# Patient Record
Sex: Female | Born: 2000 | Race: Black or African American | Hispanic: No | Marital: Single | Smoking: Never smoker
Health system: Southern US, Community
[De-identification: ages and names within clinical notes are randomized; demographics above are authoritative.]

## PROBLEM LIST (undated history)

## (undated) DIAGNOSIS — Z789 Other specified health status: Secondary | ICD-10-CM

## (undated) DIAGNOSIS — G43909 Migraine, unspecified, not intractable, without status migrainosus: Secondary | ICD-10-CM

## (undated) DIAGNOSIS — F3481 Disruptive mood dysregulation disorder: Secondary | ICD-10-CM

## (undated) DIAGNOSIS — J309 Allergic rhinitis, unspecified: Secondary | ICD-10-CM

## (undated) HISTORY — PX: NO PAST SURGERIES: SHX2092

---
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
## 2020-03-19 ENCOUNTER — Emergency Department (HOSPITAL_COMMUNITY)
Admission: EM | Admit: 2020-03-19 | Discharge: 2020-03-19 | Discharge status: Against Medical Advice | Payer: Medicaid Other

## 2020-03-19 ENCOUNTER — Ambulatory Visit (HOSPITAL_COMMUNITY)
Admission: EM | Admit: 2020-03-19 | Discharge: 2020-03-19 | Disposition: A | Discharge status: Home / Self Care | Payer: Medicaid Other | Attending: Family Medicine | Admitting: Family Medicine

## 2020-03-19 ENCOUNTER — Encounter (HOSPITAL_COMMUNITY): Payer: Self-pay

## 2020-03-19 ENCOUNTER — Other Ambulatory Visit: Payer: Self-pay

## 2020-03-19 DIAGNOSIS — H1031 Unspecified acute conjunctivitis, right eye: Secondary | ICD-10-CM | POA: Diagnosis not present

## 2020-03-19 MED ORDER — GENTAMICIN SULFATE 0.3 % OP SOLN
1.0000 [drp] | OPHTHALMIC | 0 refills | Status: DC
Start: 2020-03-19 — End: 2021-08-04

## 2020-03-19 NOTE — ED Triage Notes (Signed)
Pt presents with right eye redness, swelling, and drainage since last night.

## 2020-03-19 NOTE — ED Provider Notes (Signed)
[facility]    CSN: [id number] Arrival date & time: 03/19/20  1912      History   Chief Complaint Chief Complaint  Patient presents with  . Conjunctivitis    HPI Lori [name] is a 19 y.o. female.  Patient presents with red eye that is draining and itching.  Eye was matted together when she woke up this morning.  No known exposure to conjunctivitis.  She denies any photophobia or pain.  HPI  History reviewed. No pertinent past medical history.  Patient Active Problem List   Diagnosis Date Noted  . Acute bacterial conjunctivitis of right eye 03/19/2020    History reviewed. No pertinent surgical history.  OB History   No obstetric history on file.      Home Medications    Prior to Admission medications   Medication Sig Start Date End Date Taking? Authorizing Provider  gentamicin (GARAMYCIN) 0.3 % ophthalmic solution Place 1 drop into the right eye every 4 (four) hours. 03/19/20   Frederica Kuster, MD    Family History Family History  Family history unknown: Yes    Social History Social History   Tobacco Use  . Smoking status: Never Smoker  Substance Use Topics  . Alcohol use: Not on file  . Drug use: Not on file     Allergies   Patient has no known allergies.   Review of Systems Review of Systems  Eyes: Positive for discharge, redness and itching.  All other systems reviewed and are negative.    Physical Exam Triage Vital Signs ED Triage Vitals  Enc Vitals Group     BP 03/19/20 1941 116/66     Pulse Rate 03/19/20 1941 99     Resp 03/19/20 1941 18     Temp 03/19/20 1941 98.3 F (36.8 C)     Temp Source 03/19/20 1941 Oral     SpO2 03/19/20 1941 95 %     Weight --      Height --      Head Circumference --      Peak Flow --      Pain Score 03/19/20 1942 4     Pain Loc --      Pain Edu? --      Excl. in GC? --    No data found.  Updated Vital Signs BP 116/66 (BP Location: Right Arm)   Pulse 99   Temp 98.3 F (36.8 C)  (Oral)   Resp 18   LMP 03/12/2020   SpO2 95%   Visual Acuity Right Eye Distance:   Left Eye Distance:   Bilateral Distance:    Right Eye Near:   Left Eye Near:    Bilateral Near:     Physical Exam Vitals and nursing note reviewed.  Constitutional:      Appearance: Normal appearance. She is normal weight.  HENT:     Right Ear: Tympanic membrane normal.     Left Ear: Tympanic membrane normal.     Mouth/Throat:     Mouth: Mucous membranes are moist.  Eyes:     General:        Right eye: Discharge present.     Comments: Marked injection, OD  Cardiovascular:     Rate and Rhythm: Normal rate.  Pulmonary:     Effort: Pulmonary effort is normal.  Neurological:     Mental Status: She is alert.      UC Treatments / Results  Labs (all labs ordered are listed,  but only abnormal results are displayed) Labs Reviewed - No data to display  EKG   Radiology No results found.  Procedures Procedures (including critical care time)  Medications Ordered in UC Medications - No data to display  Initial Impression / Assessment and Plan / UC Course  I have reviewed the triage vital signs and the nursing notes.  Pertinent labs & imaging results that were available during my care of the patient were reviewed by me and considered in my medical decision making (see chart for details).     Conjunctivitis, right Final Clinical Impressions(s) / UC Diagnoses   Final diagnoses:  Acute bacterial conjunctivitis of right eye   Discharge Instructions   None    ED Prescriptions    Medication Sig Dispense Auth. Provider   gentamicin (GARAMYCIN) 0.3 % ophthalmic solution Place 1 drop into the right eye every 4 (four) hours. 5 mL Frederica Kuster, MD     PDMP not reviewed this encounter.   Frederica Kuster, MD 03/19/20 [phone]

## 2021-03-11 ENCOUNTER — Emergency Department (HOSPITAL_COMMUNITY)
Admission: EM | Admit: 2021-03-11 | Discharge: 2021-03-11 | Disposition: A | Discharge status: Home / Self Care | Payer: Medicaid Other | Attending: Emergency Medicine | Admitting: Emergency Medicine

## 2021-03-11 ENCOUNTER — Encounter (HOSPITAL_COMMUNITY): Payer: Self-pay

## 2021-03-11 ENCOUNTER — Other Ambulatory Visit: Payer: Self-pay

## 2021-03-11 DIAGNOSIS — J029 Acute pharyngitis, unspecified: Secondary | ICD-10-CM | POA: Insufficient documentation

## 2021-03-11 DIAGNOSIS — Z28310 Unvaccinated for covid-19: Secondary | ICD-10-CM | POA: Diagnosis not present

## 2021-03-11 DIAGNOSIS — Z20822 Contact with and (suspected) exposure to covid-19: Secondary | ICD-10-CM | POA: Insufficient documentation

## 2021-03-11 DIAGNOSIS — R070 Pain in throat: Secondary | ICD-10-CM | POA: Diagnosis present

## 2021-03-11 LAB — POC URINE PREG, ED: Preg Test, Ur: NEGATIVE

## 2021-03-11 LAB — SARS CORONAVIRUS 2 (TAT 6-24 HRS): SARS Coronavirus 2: NEGATIVE

## 2021-03-11 LAB — GROUP A STREP BY PCR: Group A Strep by PCR: NOT DETECTED

## 2021-03-11 NOTE — ED Provider Notes (Signed)
Started [facility]PT Provider Note   CSN: [id number] Arrival date & time: 03/11/21  5701     History Chief Complaint  Patient presents with   Sore Throat    Lori [name] is a 20 y.o. female with no pertinent past medical history that presents emerged department today for sore throat.  Patient states that she is actually really here for work note.  Patient states that she went to work today, however they wanted to come get evaluated because she started developing a sore throat.  Patient states that the sore throat is mild.  Patient states that sore throat began yesterday.  Denies any fevers, cough, congestion, facial pain, ear pain or eye discharge.  Patient denies any sick contacts.  Has not been vaccinated against COVID.  Denies any drooling, dysphagia, difficulty speaking, muffled voice, trouble swallowing, trismus.  Patient also concerned that she could be pregnant, her menstrual cycle is supposed to start today and it did not.  States that its otherwise been on time, denies any OCPs.  Denies any nausea, vomiting, abdominal pain, urinary or vaginal symptoms.  States that she is primarily here for a urine test to see if she is pregnant. HPI     History reviewed. No pertinent past medical history.  Patient Active Problem List   Diagnosis Date Noted   Acute bacterial conjunctivitis of right eye 03/19/2020    History reviewed. No pertinent surgical history.   OB History   No obstetric history on file.     Family History  Family history unknown: Yes    Social History   Tobacco Use   Smoking status: Never    Home Medications Prior to Admission medications   Medication Sig Start Date End Date Taking? Authorizing Provider  gentamicin (GARAMYCIN) 0.3 % ophthalmic solution Place 1 drop into the right eye every 4 (four) hours. 03/19/20   Frederica Kuster, MD    Allergies    Patient has no known allergies.  Review of Systems   Review of  Systems  Constitutional:  Negative for chills, diaphoresis, fatigue and fever.  HENT:  Positive for sore throat. Negative for congestion, ear pain, facial swelling, postnasal drip and trouble swallowing.   Eyes:  Negative for pain and visual disturbance.  Respiratory:  Negative for cough, shortness of breath and wheezing.   Cardiovascular:  Negative for chest pain, palpitations and leg swelling.  Gastrointestinal:  Negative for abdominal distention, abdominal pain, diarrhea, nausea and vomiting.  Genitourinary:  Negative for difficulty urinating.  Musculoskeletal:  Negative for back pain, neck pain and neck stiffness.  Skin:  Negative for pallor.  Neurological:  Negative for dizziness, speech difficulty, weakness and headaches.  Psychiatric/Behavioral:  Negative for confusion.    Physical Exam Updated Vital Signs BP 126/64 (BP Location: Right Arm)   Pulse 77   Temp 98 F (36.7 C) (Oral)   Resp 16   SpO2 100%   Physical Exam Constitutional:      General: She is not in acute distress.    Appearance: Normal appearance. She is not ill-appearing, toxic-appearing or diaphoretic.  HENT:     Head: Normocephalic and atraumatic.     Jaw: There is normal jaw occlusion. No trismus, swelling or malocclusion.     Nose: No congestion or rhinorrhea.     Right Sinus: No maxillary sinus tenderness or frontal sinus tenderness.     Left Sinus: No maxillary sinus tenderness or frontal sinus tenderness.     Mouth/Throat:  Mouth: Mucous membranes are moist. No oral lesions.     Dentition: Normal dentition.     Tongue: No lesions.     Palate: No mass and lesions.     Pharynx: Uvula midline. Pharyngeal swelling and posterior oropharyngeal erythema present. No oropharyngeal exudate or uvula swelling.     Tonsils: No tonsillar exudate or tonsillar abscesses. 1+ on the right. 2+ on the left.     Comments:  Left tonsil appears slightly erythematous, 2+.  No lesions on tonsil.  Not suppurative.  No signs  of peritonsillar abscess, palate without any tenderness or masses palpated.  No swelling under the tongue, uvula is midline without any inflammation. Eyes:     General: No visual field deficit.       Right eye: No discharge.        Left eye: No discharge.     Extraocular Movements: Extraocular movements intact.     Conjunctiva/sclera: Conjunctivae normal.     Pupils: Pupils are equal, round, and reactive to light.  Cardiovascular:     Rate and Rhythm: Normal rate and regular rhythm.     Pulses: Normal pulses.     Heart sounds: Normal heart sounds. No murmur heard.   No friction rub. No gallop.  Pulmonary:     Effort: Pulmonary effort is normal. No respiratory distress.     Breath sounds: Normal breath sounds. No stridor. No wheezing, rhonchi or rales.  Chest:     Chest wall: No tenderness.  Abdominal:     General: Abdomen is flat. Bowel sounds are normal. There is no distension.     Palpations: Abdomen is soft.     Tenderness: There is no abdominal tenderness. There is no right CVA tenderness or left CVA tenderness.  Musculoskeletal:        General: No swelling or tenderness. Normal range of motion.     Cervical back: Normal range of motion. No rigidity or tenderness.     Right lower leg: No edema.     Left lower leg: No edema.  Lymphadenopathy:     Cervical: No cervical adenopathy.  Skin:    General: Skin is warm and dry.     Capillary Refill: Capillary refill takes less than 2 seconds.     Findings: No erythema or rash.  Neurological:     General: No focal deficit present.     Mental Status: She is alert and oriented to person, place, and time.     Cranial Nerves: Cranial nerves are intact. No cranial nerve deficit or facial asymmetry.     Motor: Motor function is intact. No weakness.     Coordination: Coordination is intact.     Gait: Gait is intact. Gait normal.  Psychiatric:        Mood and Affect: Mood normal.    ED Results / Procedures / Treatments   Labs (all  labs ordered are listed, but only abnormal results are displayed) Labs Reviewed  GROUP A STREP BY PCR  SARS CORONAVIRUS 2 (TAT 6-24 HRS)  POC URINE PREG, ED    EKG None  Radiology No results found.  Procedures Procedures   Medications Ordered in ED Medications - No data to display  ED Course  I have reviewed the triage vital signs and the nursing notes.  Pertinent labs & imaging results that were available during my care of the patient were reviewed by me and considered in my medical decision making (see chart for details).    MDM  Rules/Calculators/A&P                          Patient presents emerged from today for mild sore throat.  Physical exam is benign, no signs of peritonsillar abscess, deep space abscess or Ludwig's.  Patient is tolerating fluids well, primarily here for a work note.  Patient also requesting COVID testing and pregnancy testing at this time.  Patient appears very well, no shortness of breath, difficulty breathing.   Strep test negative.  Patient this time.  Patient will follow-up with COVID testing.  Patient was likely with viral pharyngitis, symptomatic treatment discussed.  Patient be discharged.  Doubt need for further emergent work up at this time. I explained the diagnosis and have given explicit precautions to return to the ER including for any other new or worsening symptoms. The patient understands and accepts the medical plan as it's been dictated and I have answered their questions. Discharge instructions concerning home care and prescriptions have been given. The patient is STABLE and is discharged to home in good condition.  .  Final Clinical Impression(s) / ED Diagnoses Final diagnoses:  Viral pharyngitis    Rx / DC Orders ED Discharge Orders     None        Farrel Gordon, PA-C 03/11/21 1215    Jacalyn Lefevre, MD 03/11/21 1525

## 2021-03-11 NOTE — ED Notes (Signed)
Patient at desk inquiring length of wait for results, doctor's note, and discharge paperwork. Advised patient awaiting results and discharge paperwork and doctor's note will be provided at time of discharge.

## 2021-03-11 NOTE — ED Triage Notes (Signed)
Pt reports sore throat since yesterday.  

## 2021-03-11 NOTE — Discharge Instructions (Signed)
  You were evaluated in the Emergency Department and after careful evaluation, we did not find any emergent condition requiring admission or further testing in the hospital.   Your exam/testing today was overall reassuring.  Symptoms seem to be due to a viral syndrome, please use the attached instructions.  Your COVID test is pending, this is positive which you can find on MyChart you need to follow CDC guidelines.  Make sure to take Tylenol instructed on the bottle, use salt water and gargle your mouth.  If you have any new worsening concerning symptoms such as difficulty swallowing, difficulty breathing or difficulty opening your mouth please come back to the ER. Please return to the Emergency Department if you experience any worsening of your condition.  Thank you for allowing [place] to be a part of your care. Please speak to your pharmacist about any new medications prescribed today in regards to side effects or interactions with other medications.

## 2021-05-03 ENCOUNTER — Other Ambulatory Visit: Payer: Self-pay

## 2021-05-03 ENCOUNTER — Encounter (HOSPITAL_BASED_OUTPATIENT_CLINIC_OR_DEPARTMENT_OTHER): Payer: Self-pay | Admitting: Emergency Medicine

## 2021-05-03 ENCOUNTER — Emergency Department (HOSPITAL_BASED_OUTPATIENT_CLINIC_OR_DEPARTMENT_OTHER)
Admission: EM | Admit: 2021-05-03 | Discharge: 2021-05-03 | Disposition: A | Discharge status: Home / Self Care | Payer: Medicaid Other | Attending: Emergency Medicine | Admitting: Emergency Medicine

## 2021-05-03 DIAGNOSIS — Z5321 Procedure and treatment not carried out due to patient leaving prior to being seen by health care provider: Secondary | ICD-10-CM | POA: Diagnosis not present

## 2021-05-03 DIAGNOSIS — Z20822 Contact with and (suspected) exposure to covid-19: Secondary | ICD-10-CM | POA: Insufficient documentation

## 2021-05-03 DIAGNOSIS — R109 Unspecified abdominal pain: Secondary | ICD-10-CM | POA: Insufficient documentation

## 2021-05-03 DIAGNOSIS — O26899 Other specified pregnancy related conditions, unspecified trimester: Secondary | ICD-10-CM | POA: Diagnosis not present

## 2021-05-03 DIAGNOSIS — Z3A00 Weeks of gestation of pregnancy not specified: Secondary | ICD-10-CM | POA: Insufficient documentation

## 2021-05-03 LAB — URINALYSIS, ROUTINE W REFLEX MICROSCOPIC
Bilirubin Urine: NEGATIVE
Glucose, UA: NEGATIVE mg/dL
Hgb urine dipstick: NEGATIVE
Ketones, ur: NEGATIVE mg/dL
Leukocytes,Ua: NEGATIVE
Nitrite: NEGATIVE
Protein, ur: NEGATIVE mg/dL
Specific Gravity, Urine: 1.017 (ref 1.005–1.030)
pH: 6 (ref 5.0–8.0)

## 2021-05-03 LAB — RESP PANEL BY RT-PCR (FLU A&B, COVID) ARPGX2
Influenza A by PCR: NEGATIVE
Influenza B by PCR: NEGATIVE
SARS Coronavirus 2 by RT PCR: NEGATIVE

## 2021-05-03 LAB — PREGNANCY, URINE: Preg Test, Ur: POSITIVE — AB

## 2021-05-03 NOTE — ED Triage Notes (Signed)
Pt reports needing a covid test for work and also took a urine pregnancy test at home which resulted positive.  Pt wants to double check if she is pregnant.  Pt reports mild cramping and is due for her period today.

## 2021-05-15 ENCOUNTER — Other Ambulatory Visit: Payer: Self-pay

## 2021-05-15 ENCOUNTER — Ambulatory Visit (HOSPITAL_COMMUNITY)
Admission: EM | Admit: 2021-05-15 | Discharge: 2021-05-15 | Disposition: A | Discharge status: Home / Self Care | Payer: Medicaid Other | Attending: Physician Assistant | Admitting: Physician Assistant

## 2021-05-15 ENCOUNTER — Encounter (HOSPITAL_COMMUNITY): Payer: Self-pay

## 2021-05-15 DIAGNOSIS — O99351 Diseases of the nervous system complicating pregnancy, first trimester: Secondary | ICD-10-CM | POA: Diagnosis not present

## 2021-05-15 DIAGNOSIS — J029 Acute pharyngitis, unspecified: Secondary | ICD-10-CM | POA: Diagnosis not present

## 2021-05-15 DIAGNOSIS — G43909 Migraine, unspecified, not intractable, without status migrainosus: Secondary | ICD-10-CM | POA: Diagnosis not present

## 2021-05-15 DIAGNOSIS — Z3A01 Less than 8 weeks gestation of pregnancy: Secondary | ICD-10-CM | POA: Insufficient documentation

## 2021-05-15 DIAGNOSIS — J3489 Other specified disorders of nose and nasal sinuses: Secondary | ICD-10-CM | POA: Diagnosis not present

## 2021-05-15 DIAGNOSIS — J069 Acute upper respiratory infection, unspecified: Secondary | ICD-10-CM

## 2021-05-15 DIAGNOSIS — R051 Acute cough: Secondary | ICD-10-CM | POA: Insufficient documentation

## 2021-05-15 DIAGNOSIS — O99511 Diseases of the respiratory system complicating pregnancy, first trimester: Secondary | ICD-10-CM

## 2021-05-15 DIAGNOSIS — O26891 Other specified pregnancy related conditions, first trimester: Secondary | ICD-10-CM | POA: Diagnosis not present

## 2021-05-15 DIAGNOSIS — Z20822 Contact with and (suspected) exposure to covid-19: Secondary | ICD-10-CM | POA: Diagnosis not present

## 2021-05-15 LAB — POC INFLUENZA A AND B ANTIGEN (URGENT CARE ONLY)
INFLUENZA A ANTIGEN, POC: NEGATIVE
INFLUENZA B ANTIGEN, POC: NEGATIVE

## 2021-05-15 NOTE — ED Triage Notes (Signed)
Pt presents with c/o migraine and sore throat x 1 week.

## 2021-05-15 NOTE — ED Provider Notes (Signed)
[facility]    CSN: [id number] Arrival date & time: 05/15/21  1954      History   Chief Complaint Chief Complaint  Patient presents with   Sore Throat   Migraine    HPI Lori [name] is a 20 y.o. female.   Patient presents today with a several day history of URI symptoms.  Reports sore/scratchy throat, recurrent headaches, cough, nasal congestion, sinus pressure.  She denies any chest pain, shortness of breath, fever, nausea, vomiting.  She is [redacted] weeks pregnant with her first child.  She denies any known sick contacts.  She has not had COVID-19 vaccination or been diagnosed with COVID-19 in the past.  She does have a history of seasonal allergies but has not required antihistamines in many years.  She has tried Tylenol without improvement of symptoms.  She denies any history of asthma, chronic lung condition, smoking.  She did have to leave work early as result of symptoms and is requesting excuse note today.   History reviewed. No pertinent past medical history.  Patient Active Problem List   Diagnosis Date Noted   Acute bacterial conjunctivitis of right eye 03/19/2020    History reviewed. No pertinent surgical history.  OB History     Gravida  1   Para      Term      Preterm      AB      Living         SAB      IAB      Ectopic      Multiple      Live Births               Home Medications    Prior to Admission medications   Medication Sig Start Date End Date Taking? Authorizing Provider  gentamicin (GARAMYCIN) 0.3 % ophthalmic solution Place 1 drop into the right eye every 4 (four) hours. 03/19/20   Frederica Kuster, MD    Family History Family History  Family history unknown: Yes    Social History Social History   Tobacco Use   Smoking status: Never   Smokeless tobacco: Never     Allergies   Patient has no known allergies.   Review of Systems Review of Systems  Constitutional:  Positive for activity change.  Negative for appetite change, fatigue and fever.  HENT:  Positive for congestion, postnasal drip, sinus pressure and sore throat. Negative for sneezing.   Respiratory:  Positive for cough. Negative for shortness of breath.   Cardiovascular:  Negative for chest pain.  Gastrointestinal:  Negative for abdominal pain, diarrhea, nausea and vomiting.  Neurological:  Positive for headaches. Negative for dizziness and light-headedness.    Physical Exam Triage Vital Signs ED Triage Vitals  Enc Vitals Group     BP 05/15/21 2005 119/70     Pulse Rate 05/15/21 2003 86     Resp 05/15/21 2003 18     Temp 05/15/21 2004 98.5 F (36.9 C)     Temp Source 05/15/21 2004 Oral     SpO2 05/15/21 2003 99 %     Weight --      Height --      Head Circumference --      Peak Flow --      Pain Score 05/15/21 2000 0     Pain Loc --      Pain Edu? --      Excl. in GC? --  No data found.  Updated Vital Signs BP 119/70   Pulse 86   Temp 98.5 F (36.9 C) (Oral)   Resp 18   LMP 04/09/2021 (Exact Date) Comment: Pt states she found out she is pregnant 3 days ago.  SpO2 99%   Visual Acuity Right Eye Distance:   Left Eye Distance:   Bilateral Distance:    Right Eye Near:   Left Eye Near:    Bilateral Near:     Physical Exam Vitals reviewed.  Constitutional:      General: She is awake. She is not in acute distress.    Appearance: Normal appearance. She is well-developed. She is not ill-appearing.     Comments: Very pleasant female appears stated age in no acute distress sitting comfortably in exam room  HENT:     Head: Normocephalic and atraumatic.     Right Ear: Tympanic membrane, ear canal and external ear normal. Tympanic membrane is not erythematous or bulging.     Left Ear: Tympanic membrane, ear canal and external ear normal. Tympanic membrane is not erythematous or bulging.     Nose:     Right Sinus: Maxillary sinus tenderness and frontal sinus tenderness present.     Left Sinus:  Maxillary sinus tenderness and frontal sinus tenderness present.     Mouth/Throat:     Pharynx: Uvula midline. Posterior oropharyngeal erythema present. No oropharyngeal exudate.     Comments: Erythema and drainage in posterior pharynx Cardiovascular:     Rate and Rhythm: Normal rate and regular rhythm.     Heart sounds: Normal heart sounds, S1 normal and S2 normal. No murmur heard. Pulmonary:     Effort: Pulmonary effort is normal.     Breath sounds: Normal breath sounds. No wheezing, rhonchi or rales.     Comments: Clear to auscultation bilaterally Psychiatric:        Behavior: Behavior is cooperative.     UC Treatments / Results  Labs (all labs ordered are listed, but only abnormal results are displayed) Labs Reviewed  SARS CORONAVIRUS 2 (TAT 6-24 HRS)  POC INFLUENZA A AND B ANTIGEN (URGENT CARE ONLY)    EKG   Radiology No results found.  Procedures Procedures (including critical care time)  Medications Ordered in UC Medications - No data to display  Initial Impression / Assessment and Plan / UC Course  I have reviewed the triage vital signs and the nursing notes.  Pertinent labs & imaging results that were available during my care of the patient were reviewed by me and considered in my medical decision making (see chart for details).      Discussed likely viral etiology of symptoms.  No evidence of acute infection that warrant initiation of antibiotics today.  Flu testing was negative.  COVID test is pending.  Patient was instructed to remain in isolation until COVID test are obtained and she was given work excuse note with current CDC return to work guidelines.  Given she is pregnant, there are very few medications she can use to treat her symptoms we discussed that she should use rest and fluid for the majority of symptoms but can use nasal saline and Tylenol if needed.  Discussed alarm symptoms that warrant emergent evaluation.  Strict return precautions given to  which she expressed understanding.  Final Clinical Impressions(s) / UC Diagnoses   Final diagnoses:  Upper respiratory tract infection, unspecified type  Sore throat  Sinus pressure  Acute cough  [redacted] weeks gestation of pregnancy  Discharge Instructions      Your flu testing was negative.  We will contact you with your COVID test if it is positive.  I suspect that you have a virus.  Please continue using over-the-counter medication such as Tylenol for symptom relief.  If you have any worsening symptoms please return for reevaluation.     ED Prescriptions   None    PDMP not reviewed this encounter.   Jeani Hawking, PA-C 05/15/21 2039

## 2021-05-15 NOTE — Discharge Instructions (Addendum)
Your flu testing was negative.  We will contact you with your COVID test if it is positive.  I suspect that you have a virus.  Please continue using over-the-counter medication such as Tylenol for symptom relief.  If you have any worsening symptoms please return for reevaluation.

## 2021-05-16 LAB — SARS CORONAVIRUS 2 (TAT 6-24 HRS): SARS Coronavirus 2: NEGATIVE

## 2021-08-04 ENCOUNTER — Other Ambulatory Visit: Payer: Self-pay

## 2021-08-04 ENCOUNTER — Ambulatory Visit (HOSPITAL_COMMUNITY)
Admission: EM | Admit: 2021-08-04 | Discharge: 2021-08-04 | Disposition: A | Discharge status: Home / Self Care | Payer: Medicaid Other

## 2021-08-04 ENCOUNTER — Encounter (HOSPITAL_COMMUNITY): Payer: Self-pay

## 2021-08-04 ENCOUNTER — Encounter (HOSPITAL_COMMUNITY): Payer: Self-pay | Admitting: Obstetrics & Gynecology

## 2021-08-04 ENCOUNTER — Inpatient Hospital Stay (HOSPITAL_COMMUNITY)
Admission: AD | Admit: 2021-08-04 | Discharge: 2021-08-04 | Disposition: A | Discharge status: Home / Self Care | Payer: Medicaid Other | Attending: Obstetrics & Gynecology | Admitting: Obstetrics & Gynecology

## 2021-08-04 ENCOUNTER — Inpatient Hospital Stay (HOSPITAL_COMMUNITY): Payer: Medicaid Other

## 2021-08-04 DIAGNOSIS — Z8759 Personal history of other complications of pregnancy, childbirth and the puerperium: Secondary | ICD-10-CM | POA: Diagnosis not present

## 2021-08-04 DIAGNOSIS — Z6790 Unspecified blood type, Rh positive: Secondary | ICD-10-CM

## 2021-08-04 DIAGNOSIS — O209 Hemorrhage in early pregnancy, unspecified: Secondary | ICD-10-CM | POA: Insufficient documentation

## 2021-08-04 DIAGNOSIS — Z3A00 Weeks of gestation of pregnancy not specified: Secondary | ICD-10-CM | POA: Diagnosis not present

## 2021-08-04 DIAGNOSIS — O4690 Antepartum hemorrhage, unspecified, unspecified trimester: Secondary | ICD-10-CM | POA: Diagnosis not present

## 2021-08-04 DIAGNOSIS — Z3200 Encounter for pregnancy test, result unknown: Secondary | ICD-10-CM

## 2021-08-04 DIAGNOSIS — O3680X0 Pregnancy with inconclusive fetal viability, not applicable or unspecified: Secondary | ICD-10-CM | POA: Diagnosis not present

## 2021-08-04 DIAGNOSIS — O26891 Other specified pregnancy related conditions, first trimester: Secondary | ICD-10-CM | POA: Insufficient documentation

## 2021-08-04 DIAGNOSIS — N939 Abnormal uterine and vaginal bleeding, unspecified: Secondary | ICD-10-CM | POA: Diagnosis not present

## 2021-08-04 DIAGNOSIS — R102 Pelvic and perineal pain: Secondary | ICD-10-CM | POA: Diagnosis not present

## 2021-08-04 LAB — URINALYSIS, MICROSCOPIC (REFLEX)

## 2021-08-04 LAB — URINALYSIS, ROUTINE W REFLEX MICROSCOPIC
Bilirubin Urine: NEGATIVE
Glucose, UA: NEGATIVE mg/dL
Ketones, ur: NEGATIVE mg/dL
Leukocytes,Ua: NEGATIVE
Nitrite: NEGATIVE
Protein, ur: 30 mg/dL — AB
Specific Gravity, Urine: >1.03 — ABNORMAL HIGH (ref 1.005–1.030)
pH: 6 (ref 5.0–8.0)

## 2021-08-04 LAB — WET PREP, GENITAL
Clue Cells Wet Prep HPF POC: NONE SEEN
Sperm: NONE SEEN
Trich, Wet Prep: NONE SEEN
WBC, Wet Prep HPF POC: >=10 — AB (ref <10)
Yeast Wet Prep HPF POC: NONE SEEN

## 2021-08-04 LAB — ABO/RH: ABO/RH(D): A POS

## 2021-08-04 LAB — CBC
HCT: 32.1 % — ABNORMAL LOW (ref 36.0–46.0)
Hemoglobin: 10.7 g/dL — ABNORMAL LOW (ref 12.0–15.0)
MCH: 28.5 pg (ref 26.0–34.0)
MCHC: 33.3 g/dL (ref 30.0–36.0)
MCV: 85.6 fL (ref 80.0–100.0)
Platelets: 207 10*3/uL (ref 150–400)
RBC: 3.75 MIL/uL — ABNORMAL LOW (ref 3.87–5.11)
RDW: 14.2 % (ref 11.5–15.5)
WBC: 4.8 10*3/uL (ref 4.0–10.5)
nRBC: 0 % (ref 0.0–0.2)

## 2021-08-04 LAB — HCG, QUANTITATIVE, PREGNANCY: hCG, Beta Chain, Quant, S: 45 m[IU]/mL — ABNORMAL HIGH (ref <5)

## 2021-08-04 LAB — POCT PREGNANCY, URINE: Preg Test, Ur: POSITIVE — AB

## 2021-08-04 MED ORDER — ACETAMINOPHEN 500 MG PO TABS: 1000.0000 mg | ORAL_TABLET | Freq: Four times a day (QID) | ORAL | Status: DC | PRN

## 2021-08-04 NOTE — ED Triage Notes (Signed)
Pt presents for HCG blood work.  States she had an abortion on 11/26 and states she recently took at home pregnancy tests that resulted positive.   Pt reports vaginal bleeding and abdominal cramping X 1 week-2.

## 2021-08-04 NOTE — MAU Note (Addendum)
Presents with c/o abdominal cramping and VB.  States had a abortion 07/04/2021, unsure if new pregnancy.  Reports has had unprotected sex since abortion. Reports went to Urgent Care and instructed to go to MAU for evaluation.

## 2021-08-04 NOTE — MAU Provider Note (Signed)
History     CSN: [id number]  Arrival date and time: 08/04/21 5284   Event Date/Time   First Provider Initiated Contact with Patient 08/04/21 1023      Chief Complaint  Patient presents with   Abdominal Pain   Vaginal Bleeding   20 y.o. G1 s/p surgical termination on 07/04/21 presenting with VB and cramping. Reports onset of sx 1.5 wks ago. She had stopped bleeding 1 week after the procedure. Bleeding has been like a period and requires pads, she is changing q2 hr but not saturating. Rates pain 8/10. Has not treated it. She reports having unprotected IC 4 times since her termination.   OB History     Gravida  1   Para      Term      Preterm      AB      Living         SAB      IAB      Ectopic      Multiple      Live Births              History reviewed. No pertinent past medical history.  History reviewed. No pertinent surgical history.  Family History  Problem Relation Age of Onset   Varicose Veins Father     Social History   Tobacco Use   Smoking status: Never   Smokeless tobacco: Never  Vaping Use   Vaping Use: Some days  Substance Use Topics   Drug use: Yes    Types: Marijuana    Allergies: No Known Allergies  Medications Prior to Admission  Medication Sig Dispense Refill Last Dose   gentamicin (GARAMYCIN) 0.3 % ophthalmic solution Place 1 drop into the right eye every 4 (four) hours. 5 mL 0     Review of Systems  Constitutional:  Negative for fever.  Gastrointestinal:  Positive for abdominal pain.  Genitourinary:  Positive for vaginal bleeding.  Physical Exam   Blood pressure 108/80, pulse 66, temperature 98.7 F (37.1 C), temperature source Oral, resp. rate 16, height 5\' 7"  (1.702 m), weight 64.5 kg, last menstrual period 04/09/2021, SpO2 100 %.  Physical Exam Vitals and nursing note reviewed. Exam conducted with a chaperone present.  Constitutional:      General: She is not in acute distress.    Appearance: Normal  appearance.  HENT:     Head: Normocephalic and atraumatic.  Pulmonary:     Effort: Pulmonary effort is normal. No respiratory distress.  Abdominal:     General: There is no distension.     Palpations: Abdomen is soft. There is no mass.     Tenderness: There is no abdominal tenderness. There is no guarding or rebound.     Hernia: No hernia is present.  Genitourinary:    Comments: External: no lesions or erythema Vagina: rugated, pink, moist, small amt drk red bloody discharge Uterus: non enlarged, anteverted, non tender, no CMT Adnexae: no masses, no tenderness left, + tenderness right Cervix closed   Musculoskeletal:        General: Normal range of motion.     Cervical back: Normal range of motion.  Skin:    General: Skin is warm and dry.  Neurological:     General: No focal deficit present.     Mental Status: She is alert and oriented to person, place, and time.  Psychiatric:        Mood and Affect: Mood normal.  Behavior: Behavior normal.   Results for orders placed or performed during the hospital encounter of 08/04/21 (from the past 24 hour(s))  Pregnancy, urine POC     Status: Abnormal   Collection Time: 08/04/21  9:52 AM  Result Value Ref Range   Preg Test, Ur POSITIVE (A) NEGATIVE  Urinalysis, Routine w reflex microscopic Urine, Clean Catch     Status: Abnormal   Collection Time: 08/04/21  9:55 AM  Result Value Ref Range   Color, Urine YELLOW YELLOW   APPearance CLEAR CLEAR   Specific Gravity, Urine >1.030 (H) 1.005 - 1.030   pH 6.0 5.0 - 8.0   Glucose, UA NEGATIVE NEGATIVE mg/dL   Hgb urine dipstick LARGE (A) NEGATIVE   Bilirubin Urine NEGATIVE NEGATIVE   Ketones, ur NEGATIVE NEGATIVE mg/dL   Protein, ur 30 (A) NEGATIVE mg/dL   Nitrite NEGATIVE NEGATIVE   Leukocytes,Ua NEGATIVE NEGATIVE  Urinalysis, Microscopic (reflex)     Status: Abnormal   Collection Time: 08/04/21  9:55 AM  Result Value Ref Range   RBC / HPF 0-5 0 - 5 RBC/hpf   WBC, UA 0-5 0 - 5  WBC/hpf   Bacteria, UA FEW (A) NONE SEEN   Squamous Epithelial / LPF 6-10 0 - 5   Non Squamous Epithelial PRESENT (A) NONE SEEN  Wet prep, genital     Status: Abnormal   Collection Time: 08/04/21 10:36 AM   Specimen: PATH Cytology Cervicovaginal Ancillary Only  Result Value Ref Range   Yeast Wet Prep HPF POC NONE SEEN NONE SEEN   Trich, Wet Prep NONE SEEN NONE SEEN   Clue Cells Wet Prep HPF POC NONE SEEN NONE SEEN   WBC, Wet Prep HPF POC >=10 (A) <10   Sperm NONE SEEN   CBC     Status: Abnormal   Collection Time: 08/04/21 10:42 AM  Result Value Ref Range   WBC 4.8 4.0 - 10.5 K/uL   RBC 3.75 (L) 3.87 - 5.11 MIL/uL   Hemoglobin 10.7 (L) 12.0 - 15.0 g/dL   HCT 37.5 (L) 43.6 - 06.7 %   MCV 85.6 80.0 - 100.0 fL   MCH 28.5 26.0 - 34.0 pg   MCHC 33.3 30.0 - 36.0 g/dL   RDW 70.3 40.3 - 52.4 %   Platelets 207 150 - 400 K/uL   nRBC 0.0 0.0 - 0.2 %  ABO/Rh     Status: None   Collection Time: 08/04/21 10:42 AM  Result Value Ref Range   ABO/RH(D)      A POS Performed at [facility], [address], [place], [place] [address]   hCG, quantitative, pregnancy     Status: Abnormal   Collection Time: 08/04/21 10:42 AM  Result Value Ref Range   hCG, Beta Chain, Quant, S 45 (H) <5 mIU/mL   US OB LESS THAN 14 WEEKS WITH OB TRANSVAGINAL  Result Date: 08/04/2021 CLINICAL DATA:  Vaginal bleeding EXAM: OBSTETRIC <14 WK [place] AND TRANSVAGINAL OB US DOPPLER ULTRASOUND OF OVARIES TECHNIQUE: Both transabdominal and transvaginal ultrasound examinations were performed for complete evaluation of the gestation as well as the maternal uterus, adnexal regions, and pelvic cul-de-sac. Transvaginal technique was performed to assess early pregnancy. Color and duplex Doppler ultrasound was utilized to evaluate blood flow to the ovaries. COMPARISON:  None. FINDINGS: Intrauterine gestational sac: Not seen Yolk sac:  Not seen Embryo:  Not seen Cardiac Activity: Not seen Endometrial stripe measures approximately 3.8  mm. Cervix is closed. There is no demonstrable  fluid collection in the endometrial cavity. Maternal uterus/adnexae: There is 2.1 cm complex cyst in the right adnexa. Small follicles are seen in the left ovary. There is no free fluid in the pelvis. IMPRESSION: There is no demonstrable intrauterine gestational sac. If pregnancy test is positive, differential diagnostic possibilities would include failed gestation with complete abortion or very early IUP or ectopic gestation. Serial HCG estimations and follow-up sonogram as warranted should be considered. There is 2.1 cm complex cyst in the right adnexa, possibly hemorrhagic cyst/follicle. Electronically Signed   By: Ernie Avena M.D.   On: 08/04/2021 11:30    MAU Course  Procedures Tylenol  MDM Labs and [place] ordered and reviewed. No IUGS, YS or FP seen on [place], findings could indicate new pregnancy or could be from recent pregnancy termination. Will follow quant in 48 hrs. Stable for discharge home.   Assessment and Plan   1. Pregnancy, location unknown   2. Vaginal bleeding in pregnancy   3. Blood type, Rh positive    Discharge home Follow up at [facility] in 2 days Ectopic/SAB precautions Abstain from IC or use condoms  Allergies as of 08/04/2021   No Known Allergies      Medication List     STOP taking these medications    gentamicin 0.3 % ophthalmic solution Commonly known as: Lynwood Dawley, CNM 08/04/2021, 12:13 PM

## 2021-08-04 NOTE — ED Provider Notes (Signed)
[facility]    CSN: [id number] Arrival date & time: 08/04/21  0827      History   Chief Complaint Chief Complaint  Patient presents with   Vaginal Bleeding   Abdominal Pain    HPI Lori [name] is a 20 y.o. female.   Patient here today for evaluation of abdominal cramping and bleeding she has had the last several weeks. She reports that she had a surgical abortion on 11/26. Since that time she has had unprotected sex. She took an at home pregnancy test over a week ago that was positive.   The history is provided by the patient.  Vaginal Bleeding Associated symptoms: abdominal pain and vaginal discharge   Associated symptoms: no fever and no nausea   Abdominal Pain Associated symptoms: vaginal bleeding and vaginal discharge   Associated symptoms: no chills, no fever, no nausea, no shortness of breath and no vomiting    History reviewed. No pertinent past medical history.  Patient Active Problem List   Diagnosis Date Noted   Acute bacterial conjunctivitis of right eye 03/19/2020    History reviewed. No pertinent surgical history.  OB History     Gravida  1   Para      Term      Preterm      AB      Living         SAB      IAB      Ectopic      Multiple      Live Births               Home Medications    Prior to Admission medications   Medication Sig Start Date End Date Taking? Authorizing Provider  gentamicin (GARAMYCIN) 0.3 % ophthalmic solution Place 1 drop into the right eye every 4 (four) hours. 03/19/20   Frederica Kuster, MD    Family History Family History  Family history unknown: Yes    Social History Social History   Tobacco Use   Smoking status: Never   Smokeless tobacco: Never     Allergies   Patient has no known allergies.   Review of Systems Review of Systems  Constitutional:  Negative for chills and fever.  Eyes:  Negative for discharge and redness.  Respiratory:  Negative for shortness of  breath.   Gastrointestinal:  Positive for abdominal pain. Negative for nausea and vomiting.  Genitourinary:  Positive for vaginal bleeding and vaginal discharge.    Physical Exam Triage Vital Signs ED Triage Vitals  Enc Vitals Group     BP 08/04/21 0853 116/66     Pulse Rate 08/04/21 0853 85     Resp 08/04/21 0853 17     Temp --      Temp src --      SpO2 08/04/21 0853 98 %     Weight --      Height --      Head Circumference --      Peak Flow --      Pain Score 08/04/21 0852 0     Pain Loc --      Pain Edu? --      Excl. in GC? --    No data found.  Updated Vital Signs BP 116/66 (BP Location: Left Arm)    Pulse 85    Resp 17    LMP 04/09/2021 (Exact Date) Comment: Pt states she found out she is pregnant 3 days ago.  SpO2 98%      Physical Exam Vitals and nursing note reviewed.  Constitutional:      General: She is not in acute distress.    Appearance: Normal appearance. She is not ill-appearing.  HENT:     Head: Normocephalic and atraumatic.  Eyes:     Conjunctiva/sclera: Conjunctivae normal.  Cardiovascular:     Rate and Rhythm: Normal rate.  Pulmonary:     Effort: Pulmonary effort is normal.  Neurological:     Mental Status: She is alert.  Psychiatric:        Mood and Affect: Mood normal.        Behavior: Behavior normal.        Thought Content: Thought content normal.     UC Treatments / Results  Labs (all labs ordered are listed, but only abnormal results are displayed) Labs Reviewed - No data to display  EKG   Radiology No results found.  Procedures Procedures (including critical care time)  Medications Ordered in UC Medications - No data to display  Initial Impression / Assessment and Plan / UC Course  I have reviewed the triage vital signs and the nursing notes.  Pertinent labs & imaging results that were available during my care of the patient were reviewed by me and considered in my medical decision making (see chart for details).     Recommended further evaluation in [facility] ED as I suspect she will need transvaginal ultrasound, stat blood work etc. Patient is stable on discharge and will report to ED via POV after leaving our office.  Final Clinical Impressions(s) / UC Diagnoses   Final diagnoses:  Vaginal bleeding  Possible pregnancy  Abortion history   Discharge Instructions   None    ED Prescriptions   None    PDMP not reviewed this encounter.   Tomi Bamberger, PA-C 08/04/21 [phone]

## 2021-08-05 LAB — GC/CHLAMYDIA PROBE AMP (~~LOC~~) NOT AT ARMC
Chlamydia: NEGATIVE
Comment: NEGATIVE
Comment: NORMAL
Neisseria Gonorrhea: NEGATIVE

## 2021-08-06 ENCOUNTER — Ambulatory Visit (INDEPENDENT_AMBULATORY_CARE_PROVIDER_SITE_OTHER): Payer: Medicaid Other

## 2021-08-06 ENCOUNTER — Other Ambulatory Visit: Payer: Self-pay

## 2021-08-06 VITALS — BP 115/64 | HR 61 | Wt 141.7 lb

## 2021-08-06 DIAGNOSIS — O3680X0 Pregnancy with inconclusive fetal viability, not applicable or unspecified: Secondary | ICD-10-CM

## 2021-08-06 LAB — BETA HCG QUANT (REF LAB): hCG Quant: 30 m[IU]/mL

## 2021-08-06 NOTE — Progress Notes (Signed)
Pt here today for STAT beta s/p abortion on 07/04/21.  Pt reports that she is having vaginal bleeding that she wears regular tampon that she would say she changes about 3-4 times a day.  Pt denies pain at this time.  Pt explained that we call her with results in approximately two hours and f/u.  Pt verbalized understanding.  Received notification from [facility] that pt's beta results are 30.  Received recommendation by Dr. Debroah Loop who requests pt to come back in one week for non stat beta to trend levels down.  Called and left message that I am calling with results and f/u to please give the office a callback.    Pt returned call back.  I informed her the results and recommendation from provider.  Pt stated that she will be able to come in on 08/13/20 @ 1000 for a non stat beta.   Leonette Nutting  08/06/21

## 2021-08-09 NOTE — L&D Delivery Note (Signed)
LABOR COURSE Patient was admitted for SOL. She progressed to complete without intervention.  Delivery Note Patient reported to be C/C/+3. I was asked to be on standby for delivery. Patient crowning when I entered room. Head delivered ROT. Tight nuchal present x 1 loop, delivered through. Shoulder and body delivered in usual fashion. At  [place] a viable female was delivered via Vaginal, Spontaneous (Presentation:ROT ; ROA).  Infant with spontaneous cry, placed on mother's abdomen, dried and stimulated. Cord clamped x 2 after two-minute delay, and cut by FOB. Cord blood drawn. Placenta delivered spontaneously with gentle cord traction. Appears intact. Fundus firm with massage and Pitocin. Labia, perineum, vagina, and cervix inspected.    APGAR: 9, 10; weight: 3260g .   Cord: 3VC with the following complications:N/A.    Anesthesia:  Epidural Episiotomy: None Lacerations: Bilateral labial abrasions, hemostatic and therefore not repaired Suture Repair: N/A Est. Blood Loss (mL): 158  Mom to postpartum.  Baby to Couplet care / Skin to Skin.  0530: I was asked to return to bedside to evaluate patient's bleeding. Two large clots delivered with manual sweep. Fundus firm and 3 below U following sweep. Tolerated well by patient. Dr. Charlesetta Garibaldi presented to bedside during discussion of expectations for bleeding in the postpartum period. I gave report to MD and excused myself from the room. Care assumed by Dr. Charlesetta Garibaldi.  Mallie Snooks, CNM 06/14/22 6:45 AM

## 2021-08-12 ENCOUNTER — Other Ambulatory Visit: Payer: Self-pay

## 2021-08-12 DIAGNOSIS — O4690 Antepartum hemorrhage, unspecified, unspecified trimester: Secondary | ICD-10-CM

## 2021-08-13 ENCOUNTER — Other Ambulatory Visit: Payer: Self-pay

## 2021-08-13 ENCOUNTER — Other Ambulatory Visit: Payer: Medicaid Other

## 2021-08-13 DIAGNOSIS — O4690 Antepartum hemorrhage, unspecified, unspecified trimester: Secondary | ICD-10-CM

## 2021-08-14 ENCOUNTER — Encounter: Payer: Self-pay | Admitting: *Deleted

## 2021-08-14 LAB — BETA HCG QUANT (REF LAB): hCG Quant: 9 m[IU]/mL

## 2021-08-18 ENCOUNTER — Other Ambulatory Visit: Payer: Self-pay

## 2021-08-18 DIAGNOSIS — O3680X0 Pregnancy with inconclusive fetal viability, not applicable or unspecified: Secondary | ICD-10-CM

## 2021-08-18 DIAGNOSIS — O4690 Antepartum hemorrhage, unspecified, unspecified trimester: Secondary | ICD-10-CM

## 2021-08-20 ENCOUNTER — Other Ambulatory Visit: Payer: Self-pay

## 2021-08-20 ENCOUNTER — Other Ambulatory Visit: Payer: Medicaid Other

## 2021-08-20 DIAGNOSIS — O3680X0 Pregnancy with inconclusive fetal viability, not applicable or unspecified: Secondary | ICD-10-CM

## 2021-08-20 DIAGNOSIS — O4690 Antepartum hemorrhage, unspecified, unspecified trimester: Secondary | ICD-10-CM

## 2021-08-21 LAB — BETA HCG QUANT (REF LAB): hCG Quant: 3 m[IU]/mL

## 2021-09-15 ENCOUNTER — Ambulatory Visit (HOSPITAL_COMMUNITY)
Admission: EM | Admit: 2021-09-15 | Discharge: 2021-09-15 | Discharge status: Against Medical Advice | Payer: Medicaid Other

## 2021-10-19 ENCOUNTER — Telehealth: Payer: Self-pay | Admitting: Family Medicine

## 2021-10-19 NOTE — Telephone Encounter (Signed)
Patient called in wanting to get scheduled for her first New OB appt, I pulled her up and she already scheduled at the [facility] ans she stated she was going to cancel due to she didn't want to go to the office, I explained to her the same providers that work there wok here and she still wanted to be scheduled here, I told her we need proof pf pregnancy and she can do a walk in pregnancy test I gave her the hours we do them Mon through Friday 8:30 to 3:30 ( the office close for lunch 12 to 1 pm). Patient understood and the call ended.  ?

## 2021-10-27 ENCOUNTER — Encounter: Payer: Medicaid Other | Admitting: Obstetrics

## 2021-12-03 LAB — HEPATITIS C ANTIBODY: HCV Ab: NEGATIVE

## 2021-12-03 LAB — OB RESULTS CONSOLE HEPATITIS B SURFACE ANTIGEN: Hepatitis B Surface Ag: NEGATIVE

## 2021-12-03 LAB — OB RESULTS CONSOLE HIV ANTIBODY (ROUTINE TESTING): HIV: NONREACTIVE

## 2021-12-03 LAB — OB RESULTS CONSOLE RUBELLA ANTIBODY, IGM: Rubella: IMMUNE

## 2021-12-07 LAB — OB RESULTS CONSOLE GC/CHLAMYDIA
Chlamydia: NEGATIVE
Neisseria Gonorrhea: NEGATIVE

## 2021-12-23 ENCOUNTER — Other Ambulatory Visit: Payer: Self-pay

## 2021-12-23 ENCOUNTER — Emergency Department (HOSPITAL_BASED_OUTPATIENT_CLINIC_OR_DEPARTMENT_OTHER)
Admission: EM | Admit: 2021-12-23 | Discharge: 2021-12-23 | Disposition: A | Discharge status: Home / Self Care | Payer: Medicaid Other | Attending: Emergency Medicine | Admitting: Emergency Medicine

## 2021-12-23 ENCOUNTER — Encounter (HOSPITAL_BASED_OUTPATIENT_CLINIC_OR_DEPARTMENT_OTHER): Payer: Self-pay

## 2021-12-23 DIAGNOSIS — Z3A13 13 weeks gestation of pregnancy: Secondary | ICD-10-CM | POA: Insufficient documentation

## 2021-12-23 DIAGNOSIS — R1030 Lower abdominal pain, unspecified: Secondary | ICD-10-CM | POA: Insufficient documentation

## 2021-12-23 DIAGNOSIS — O26891 Other specified pregnancy related conditions, first trimester: Secondary | ICD-10-CM | POA: Diagnosis present

## 2021-12-23 DIAGNOSIS — R519 Headache, unspecified: Secondary | ICD-10-CM | POA: Diagnosis not present

## 2021-12-23 DIAGNOSIS — R42 Dizziness and giddiness: Secondary | ICD-10-CM | POA: Insufficient documentation

## 2021-12-23 LAB — COMPREHENSIVE METABOLIC PANEL
ALT: 8 U/L (ref 0–44)
AST: 12 U/L — ABNORMAL LOW (ref 15–41)
Albumin: 4.1 g/dL (ref 3.5–5.0)
Alkaline Phosphatase: 28 U/L — ABNORMAL LOW (ref 38–126)
Anion gap: 9 (ref 5–15)
BUN: 12 mg/dL (ref 6–20)
CO2: 23 mmol/L (ref 22–32)
Calcium: 8.9 mg/dL (ref 8.9–10.3)
Chloride: 102 mmol/L (ref 98–111)
Creatinine, Ser: 0.6 mg/dL (ref 0.44–1.00)
GFR, Estimated: >60 mL/min (ref >60)
Glucose, Bld: 89 mg/dL (ref 70–99)
Potassium: 3.5 mmol/L (ref 3.5–5.1)
Sodium: 134 mmol/L — ABNORMAL LOW (ref 135–145)
Total Bilirubin: 0.2 mg/dL — ABNORMAL LOW (ref 0.3–1.2)
Total Protein: 7.5 g/dL (ref 6.5–8.1)

## 2021-12-23 LAB — CBC WITH DIFFERENTIAL/PLATELET
Abs Immature Granulocytes: 0.02 10*3/uL (ref 0.00–0.07)
Basophils Absolute: 0 10*3/uL (ref 0.0–0.1)
Basophils Relative: 0 %
Eosinophils Absolute: 0.1 10*3/uL (ref 0.0–0.5)
Eosinophils Relative: 2 %
HCT: 31.9 % — ABNORMAL LOW (ref 36.0–46.0)
Hemoglobin: 10.5 g/dL — ABNORMAL LOW (ref 12.0–15.0)
Immature Granulocytes: 0 %
Lymphocytes Relative: 27 %
Lymphs Abs: 1.8 10*3/uL (ref 0.7–4.0)
MCH: 27.1 pg (ref 26.0–34.0)
MCHC: 32.9 g/dL (ref 30.0–36.0)
MCV: 82.4 fL (ref 80.0–100.0)
Monocytes Absolute: 0.5 10*3/uL (ref 0.1–1.0)
Monocytes Relative: 7 %
Neutro Abs: 4.3 10*3/uL (ref 1.7–7.7)
Neutrophils Relative %: 64 %
Platelets: 233 10*3/uL (ref 150–400)
RBC: 3.87 MIL/uL (ref 3.87–5.11)
RDW: 15.8 % — ABNORMAL HIGH (ref 11.5–15.5)
WBC: 6.8 10*3/uL (ref 4.0–10.5)
nRBC: 0 % (ref 0.0–0.2)

## 2021-12-23 LAB — URINALYSIS, ROUTINE W REFLEX MICROSCOPIC
Bilirubin Urine: NEGATIVE
Glucose, UA: NEGATIVE mg/dL
Hgb urine dipstick: NEGATIVE
Ketones, ur: NEGATIVE mg/dL
Leukocytes,Ua: NEGATIVE
Nitrite: NEGATIVE
Specific Gravity, Urine: 1.033 — ABNORMAL HIGH (ref 1.005–1.030)
pH: 6 (ref 5.0–8.0)

## 2021-12-23 LAB — HCG, QUANTITATIVE, PREGNANCY: hCG, Beta Chain, Quant, S: 47930 m[IU]/mL — ABNORMAL HIGH (ref <5)

## 2021-12-23 MED ORDER — ACETAMINOPHEN 325 MG PO TABS
650.0000 mg | ORAL_TABLET | Freq: Once | ORAL | Status: AC
  Administered 2021-12-23: 650 mg via ORAL
  Filled 2021-12-23: qty 2

## 2021-12-23 NOTE — ED Triage Notes (Signed)
She c/o feeling "lightheaded" at work today. She also tells me she is just >[redacted] weeks gestation. She is ambulatory and in no distress. ?

## 2021-12-23 NOTE — ED Provider Notes (Signed)
[facility] EMERGENCY DEPT Provider Note   CSN: [place]:[id number] Arrival date & time: 12/23/21  1507     History Chief Complaint  Patient presents with   Dizziness    Lori [name] is a 21 y.o. female.  21 year old female G1, P0 currently [redacted]w[redacted]d pregnant presents to the ED with a chief complaint of feeling lightheaded x prior to arrival.  Patient reports she was at work, currently employed at a fast [employer] please, states that she began to feel lightheaded and dizzy, decided to drive to the ED for further evaluation.  She did have a recent checkup last week with an ultrasound to confirm IUP.  She does endorse some headaches, these have been ongoing throughout her pregnancy, she usually takes Tylenol for these however did not have any today available to her.  He does endorse some lower abdominal cramping however has not had any vaginal bleeding.  No fevers, no nausea, no vomiting, no vaginal discharge.  The history is provided by the patient and medical records.  Dizziness Quality:  Unable to specify Severity:  Moderate Associated symptoms: no chest pain, no diarrhea, no nausea, no shortness of breath and no vomiting       Home Medications Prior to Admission medications   Not on File      Allergies    Patient has no known allergies.    Review of Systems   Review of Systems  Constitutional:  Negative for chills and fever.  HENT:  Negative for sore throat.   Respiratory:  Negative for shortness of breath.   Cardiovascular:  Negative for chest pain.  Gastrointestinal:  Positive for abdominal pain. Negative for diarrhea, nausea and vomiting.  Genitourinary:  Negative for flank pain.  Neurological:  Positive for dizziness and light-headedness.  All other systems reviewed and are negative.  Physical Exam Updated Vital Signs BP (!) 111/95 (BP Location: Left Arm)   Pulse 71   Temp 99.2 F (37.3 C) (Oral)   Resp 16   LMP 04/09/2021 (Exact Date) Comment: Pt states she  found out she is pregnant 3 days ago.  SpO2 100%  Physical Exam Vitals and nursing note reviewed.  Constitutional:      General: She is not in acute distress.    Appearance: She is well-developed.  HENT:     Head: Normocephalic and atraumatic.     Mouth/Throat:     Pharynx: No oropharyngeal exudate.  Eyes:     Pupils: Pupils are equal, round, and reactive to light.  Cardiovascular:     Rate and Rhythm: Regular rhythm.     Heart sounds: Normal heart sounds.  Pulmonary:     Effort: Pulmonary effort is normal. No respiratory distress.     Breath sounds: Normal breath sounds.  Abdominal:     General: Bowel sounds are normal. There is no distension.     Palpations: Abdomen is soft.     Tenderness: There is no abdominal tenderness.  Musculoskeletal:        General: No tenderness or deformity.     Cervical back: Normal range of motion.     Right lower leg: No edema.     Left lower leg: No edema.  Skin:    General: Skin is warm and dry.  Neurological:     Mental Status: She is alert and oriented to person, place, and time.    ED Results / Procedures / Treatments   Labs (all labs ordered are listed, but only abnormal results are displayed) Labs  Reviewed  CBC WITH DIFFERENTIAL/PLATELET - Abnormal; Notable for the following components:      Result Value   Hemoglobin 10.5 (*)    HCT 31.9 (*)    RDW 15.8 (*)    All other components within normal limits  COMPREHENSIVE METABOLIC PANEL - Abnormal; Notable for the following components:   Sodium 134 (*)    AST 12 (*)    Alkaline Phosphatase 28 (*)    Total Bilirubin 0.2 (*)    All other components within normal limits  HCG, QUANTITATIVE, PREGNANCY - Abnormal; Notable for the following components:   hCG, Beta Chain, Quant, S 47,930 (*)    All other components within normal limits  URINALYSIS, ROUTINE W REFLEX MICROSCOPIC - Abnormal; Notable for the following components:   Specific Gravity, Urine 1.033 (*)    Protein, ur TRACE (*)     All other components within normal limits    EKG None  Radiology No results found.  Procedures Procedures    Medications Ordered in ED Medications  acetaminophen (TYLENOL) tablet 650 mg (650 mg Oral Given 12/23/21 1700)    ED Course/ Medical Decision Making/ A&P                           Medical Decision Making Dizziness and headache in a illness [redacted] weeks pregnant female, no electrolyte derangement.  No signs of urinary tract infection.  Blood pressure is within normal limits, no signs of hypertension, some protein noted in the urine however have a lower suspicion for preeclampsia although considered.No vaginal bleeding to suggest miscarriage.  Hemoglobin is at her baseline, although lower, I did discuss with her follow-up with OB/GYN for symptomatic anemia.  Amount and/or Complexity of Data Reviewed External Data Reviewed: radiology and notes.    Details: prior chart reviewed visit with [facility]  Fundal Height- too early Fetal Heart Tones- present  No [place] on file Labs: ordered.    Details: low hemoglobin but within patients baseline.  Risk OTC drugs.   Patient presents to the ED with chief complaint of feeling lightheaded, dizzy while at work today.  Patient also endorsing a headache that has been ongoing with her pregnancy.  She is also reporting some lower abdominal cramping however no vaginal bleeding, no vaginal discharge.  Last OB/GYN visit last week and reports all within normal limits.    During evaluation patient is overall appearing, nontoxic, abdomen is soft nontender to palpation.  Lungs are clear to auscultation.  Moves all upper and lower extremities.  Ambulating in the ED with a steady gait.    Labs were obtained CBC with no leukocytosis, hemoglobin at baseline, no prior history of anemia, I did discuss with patient likely will need closer follow-up with OB/GYN.  CMP without any electrolyte derangement, current levels within normal limits.  LFTs  unremarkable.  There is some protein in her urine, however denies any urinary symptoms.  Given Tylenol with improvement in her headache here.  She does report she does not eat as much as she should.  Her headache is now improved.  Did consider preeclampsia however patient's blood pressure is within normal limits, her labs are unremarkable.  Some protein noted in the urine, however headache has resolved after Tylenol.  Patient did have ultrasound last week at [facility] appointment.  I did discuss close follow-up with OB/GYN.  Return precautions discussed at length, patient stable for discharge.    Portions of this note were  generated with [employer]. Dictation errors may occur despite best attempts at proofreading.   Final Clinical Impression(s) / ED Diagnoses Final diagnoses:  Dizziness    Rx / DC Orders ED Discharge Orders     None         Corinna Capra 12/23/21 1837    Luna Fuse, MD 12/25/21 2312

## 2021-12-23 NOTE — Discharge Instructions (Signed)
Your laboratories also are within normal limits today. ? ?I do recommend small amount of snacks throughout the day.  Your globin count was slightly low, if this persists is to drop, you may be diagnosed with anemia, you will need to follow-up with your OB/GYN. ?

## 2021-12-23 NOTE — ED Notes (Signed)
Discharge instructions and follow up care reviewed and explained, pt verbalized understanding and had no further questions at discharge. Pt caox4 and ambulatory on departure.  ?

## 2022-03-17 ENCOUNTER — Encounter (HOSPITAL_COMMUNITY): Payer: Self-pay

## 2022-03-17 ENCOUNTER — Emergency Department (HOSPITAL_COMMUNITY)
Admission: EM | Admit: 2022-03-17 | Discharge: 2022-03-18 | Disposition: A | Discharge status: Home / Self Care | Payer: Medicaid Other | Attending: Emergency Medicine | Admitting: Emergency Medicine

## 2022-03-17 ENCOUNTER — Encounter: Payer: Self-pay | Admitting: Obstetrics and Gynecology

## 2022-03-17 ENCOUNTER — Other Ambulatory Visit: Payer: Self-pay

## 2022-03-17 DIAGNOSIS — R259 Unspecified abnormal involuntary movements: Secondary | ICD-10-CM | POA: Diagnosis not present

## 2022-03-17 DIAGNOSIS — Z20822 Contact with and (suspected) exposure to covid-19: Secondary | ICD-10-CM | POA: Insufficient documentation

## 2022-03-17 DIAGNOSIS — Z046 Encounter for general psychiatric examination, requested by authority: Secondary | ICD-10-CM

## 2022-03-17 DIAGNOSIS — R45851 Suicidal ideations: Secondary | ICD-10-CM

## 2022-03-17 DIAGNOSIS — O99342 Other mental disorders complicating pregnancy, second trimester: Secondary | ICD-10-CM | POA: Diagnosis present

## 2022-03-17 DIAGNOSIS — Z008 Encounter for other general examination: Secondary | ICD-10-CM

## 2022-03-17 LAB — CBC WITH DIFFERENTIAL/PLATELET
Abs Immature Granulocytes: 0.04 10*3/uL (ref 0.00–0.07)
Basophils Absolute: 0 10*3/uL (ref 0.0–0.1)
Basophils Relative: 0 %
Eosinophils Absolute: 0.1 10*3/uL (ref 0.0–0.5)
Eosinophils Relative: 1 %
HCT: 32.8 % — ABNORMAL LOW (ref 36.0–46.0)
Hemoglobin: 10.7 g/dL — ABNORMAL LOW (ref 12.0–15.0)
Immature Granulocytes: 1 %
Lymphocytes Relative: 22 %
Lymphs Abs: 1.8 10*3/uL (ref 0.7–4.0)
MCH: 28.4 pg (ref 26.0–34.0)
MCHC: 32.6 g/dL (ref 30.0–36.0)
MCV: 87 fL (ref 80.0–100.0)
Monocytes Absolute: 0.5 10*3/uL (ref 0.1–1.0)
Monocytes Relative: 7 %
Neutro Abs: 5.7 10*3/uL (ref 1.7–7.7)
Neutrophils Relative %: 69 %
Platelets: 212 10*3/uL (ref 150–400)
RBC: 3.77 MIL/uL — ABNORMAL LOW (ref 3.87–5.11)
RDW: 13.3 % (ref 11.5–15.5)
WBC: 8.1 10*3/uL (ref 4.0–10.5)
nRBC: 0 % (ref 0.0–0.2)

## 2022-03-17 LAB — COMPREHENSIVE METABOLIC PANEL
ALT: 25 U/L (ref 0–44)
AST: 24 U/L (ref 15–41)
Albumin: 3.3 g/dL — ABNORMAL LOW (ref 3.5–5.0)
Alkaline Phosphatase: 61 U/L (ref 38–126)
Anion gap: 8 (ref 5–15)
BUN: 10 mg/dL (ref 6–20)
CO2: 20 mmol/L — ABNORMAL LOW (ref 22–32)
Calcium: 8.9 mg/dL (ref 8.9–10.3)
Chloride: 106 mmol/L (ref 98–111)
Creatinine, Ser: 0.69 mg/dL (ref 0.44–1.00)
GFR, Estimated: >60 mL/min (ref >60)
Glucose, Bld: 82 mg/dL (ref 70–99)
Potassium: 3.2 mmol/L — ABNORMAL LOW (ref 3.5–5.1)
Sodium: 134 mmol/L — ABNORMAL LOW (ref 135–145)
Total Bilirubin: 0.4 mg/dL (ref 0.3–1.2)
Total Protein: 7.1 g/dL (ref 6.5–8.1)

## 2022-03-17 LAB — RAPID URINE DRUG SCREEN, HOSP PERFORMED
Amphetamines: NOT DETECTED
Barbiturates: NOT DETECTED
Benzodiazepines: NOT DETECTED
Cocaine: NOT DETECTED
Opiates: NOT DETECTED
Tetrahydrocannabinol: POSITIVE — AB

## 2022-03-17 LAB — I-STAT BETA HCG BLOOD, ED (MC, WL, AP ONLY): I-stat hCG, quantitative: >2000 m[IU]/mL — ABNORMAL HIGH (ref <5)

## 2022-03-17 LAB — RESP PANEL BY RT-PCR (FLU A&B, COVID) ARPGX2
Influenza A by PCR: NEGATIVE
Influenza B by PCR: NEGATIVE
SARS Coronavirus 2 by RT PCR: NEGATIVE

## 2022-03-17 LAB — ETHANOL: Alcohol, Ethyl (B): <10 mg/dL (ref <10)

## 2022-03-17 MED ORDER — ZIPRASIDONE MESYLATE 20 MG IM SOLR: 20.0000 mg | Freq: Once | INTRAMUSCULAR | Status: DC

## 2022-03-17 MED ORDER — LORAZEPAM 2 MG/ML IJ SOLN: 2.0000 mg | Freq: Once | INTRAMUSCULAR | Status: DC

## 2022-03-17 NOTE — ED Notes (Signed)
Rapid OB called  

## 2022-03-17 NOTE — ED Notes (Signed)
Pt remains agitated - Pt ambulatory to restroom. OB rapid response at bedside

## 2022-03-17 NOTE — ED Notes (Signed)
Pt given snack and drink at this time. 

## 2022-03-17 NOTE — ED Notes (Signed)
Pt denies SI at this time - reports that she has never had any thoughts of SI
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
## 2022-03-17 NOTE — ED Notes (Signed)
Officers remain at bedside - Pt is refusing to give up phone at this time stating that she does not want to wait around all night and where is the mental health lady to assess me- This RN educated pt on protocol for IVC and safety - Pt wants to know when she is going to get TTS stating "I don't want to be here all night" - This RN did inform pt that she could be recommended for in patient treatment and pt states that she knows

## 2022-03-17 NOTE — ED Notes (Signed)
Pt is getting changed into gown - This RN to call rapid OB

## 2022-03-17 NOTE — ED Notes (Signed)
This RN tried to contact behavioral health to see when pt would be TTS - no answer at this time

## 2022-03-17 NOTE — ED Notes (Signed)
Pt dressed out into hospital attire, belongings and valuables collected, pt wanded by security, valuables given to security, belongings placed in small locker #1
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
## 2022-03-17 NOTE — Progress Notes (Signed)
OB rapid response nurse received a call from [facility] ED nurse Alcario Drought at 2005 regarding patient Lori [name], [redacted] weeks pregnant, IVC'd for SI. Patient report of decreased fetal movement was communicated.   Nurse spoke with [facility] attending Dr. Vergie Living regarding patient, and provider requests fetal heart tone assessment and continued communication once FHR assessed in ED.   OB rapid response nurse arrived to bedside at 2015, but delay in Fetal heart tone assessment due other nursing care being given at this time. Will assess asap.   2030 at [facility] rapid response nurse at bedside FHR assessed at145 baseline with accelerations present, no decelerations, fetal movement palpated. Patient denies pregnancy concerns at this time, denies pain, denies discharge or bleeding. Patient [place]. Reports fetal movement while in hospital. Patient is tearful with FHT assessment and very cooperative.  Dr. Vergie Living called, and provider request FHT assessment every shift while patient is in hospital.   OB rapid response gave report to ED nurse and nothing further needed at this time

## 2022-03-18 DIAGNOSIS — Z008 Encounter for other general examination: Secondary | ICD-10-CM

## 2022-03-18 DIAGNOSIS — Z046 Encounter for general psychiatric examination, requested by authority: Secondary | ICD-10-CM

## 2022-03-18 MED ORDER — ACETAMINOPHEN 325 MG PO TABS: 650.0000 mg | ORAL_TABLET | ORAL | Status: DC | PRN

## 2022-03-18 NOTE — ED Notes (Signed)
OB GYN RN at bedside to assess heart tones but the pt refused
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
## 2022-03-18 NOTE — ED Notes (Signed)
Sandwich given by sitter prior to lunch and pt also received lunch tray.  I will go find pt cell phone for NP whom is requesting we give her the phone so she can have the sisters number
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
## 2022-03-18 NOTE — ED Notes (Addendum)
Per LMFT  Rockney Ghee, NP, reviewed pt's chart and information and determined pt should receive continuous assessment and be re-assessed by psychiatry in the morning. Pt is to remain at [facility] due to the [facility] being at capacity

## 2022-03-18 NOTE — ED Provider Notes (Signed)
Emergency Medicine Observation Re-evaluation Note  Lori [name] is a 21 y.o. female, seen on rounds today.  Pt initially presented to the ED for complaints of IVC Currently, the patient is waiting to be reassessed by psychiatry. She denies the allegations of the IVC, stating she never wanted to kill herself.  Physical Exam  BP 105/60 (BP Location: Left Arm)   Pulse 65   Temp 98.1 F (36.7 C) (Oral)   Resp 16   Ht 5\' 7"  (1.702 m)   Wt 65 kg   LMP 04/09/2021 (Exact Date) Comment: Pt states she found out she is pregnant 3 days ago.  SpO2 100%   BMI 22.44 kg/m  Physical Exam General: upset, otherwise no acute distress Lungs: normal effort Psych: denies SI  ED Course / MDM  EKG:EKG Interpretation  Date/Time:  Wednesday March 17 2022 20:20:46 EDT Ventricular Rate:  59 PR Interval:  133 QRS Duration: 88 QT Interval:  400 QTC Calculation: 397 R Axis:   74 Text Interpretation: Sinus rhythm Confirmed by 10-22-2002 ([id number]) on 03/17/2022 8:32:56 PM  I have reviewed the labs performed to date as well as medications administered while in observation.  No recent changes in the last 24 hours.  Plan  Current plan is for psychiatry assessment this morning. Lori [name] is under involuntary commitment.      Chandra Batch, MD 03/18/22 [phone]

## 2022-03-18 NOTE — Progress Notes (Signed)
Attempted to doppler FHT's but patient states "He is fine and she wants to go home and to get out of her room." Informed ED RN to call me if she needs anything OB wise. Updated Dr Macon Large of patient status.  Continue to attempt to doppler QD.  Marvell Fuller RNC-OB RROB  [phone]

## 2022-03-18 NOTE — ED Notes (Signed)
Pt now has sitter at bedside

## 2022-03-18 NOTE — ED Notes (Addendum)
I did go back through the notes and the plan of care Is to reassess her this AM and go from there. ED md at bedside at this time pt however pt is IVC'd

## 2022-03-18 NOTE — ED Notes (Signed)
Pt ongoing TTS at this time  

## 2022-03-18 NOTE — ED Notes (Signed)
IVC rescinding paperwork is being faxed to the clerk of court at this time

## 2022-03-18 NOTE — ED Notes (Signed)
Pt standing in door of room yelling at staff - pt again informed on TTS protocol and pt states "so what I'm just supposed to sit here all night" - Pt asked staff for sandwich, pt informed multiple times by staff that they would look for one - while staff looking for sandwich pt continued to stand in hallway and yell - Pt asked to go back inside of room and asked not to yell and cuss in hallway - Pt continues to stand in doorway and yell and cuss at staff - MD Cardama notified of pt behavior at this time - Pt given sandwich - Pt back in room at this time

## 2022-03-18 NOTE — ED Notes (Signed)
SW consulted for [facility] bus pass brought and no further steps needed
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
## 2022-03-18 NOTE — ED Notes (Signed)
Patient was yelling in the hallway for a sandwich, and very disrespectful as I was getting vitals on H20. A nurse told her we would get one, and she continued to be disrespectful. Alcario Drought RN brought her a sandwich.

## 2022-03-18 NOTE — ED Notes (Signed)
TTS is with pt

## 2022-03-18 NOTE — ED Notes (Signed)
The patients IVC paperwork is missing from the clipboard and the medical records bin. I called around trying to obtain a copy of it and was unable to. I spoke to [facility] about the situation and he said since the patient is currently with TTS that he will wait and see what they say before deciding to complete the IVC paperwork or not.

## 2022-03-18 NOTE — ED Notes (Signed)
Pt called me into the room about what was taking so long with the Dr. Getting back to her. I explained their workload is heavy and there a few that need to be reviewed and it would happen but please be patient.  She began getting agitated saying she knew what they had told her.  I however was not involved in the conversation and although she was telling me what was said, I couldn't weigh in on it until I had time to at least read a note.  We are all trying to make sure all our pt.s are doing okay and get them all updates
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
## 2022-03-18 NOTE — ED Notes (Signed)
This NT gave the patient ginger ale per request. Patient ambulated to bathroom. This NT attempted to leave the curtain and door open twice and educate the patient we needed it open a hair per RN, and the patient shut the curtain and door both times. Nurse notified.

## 2022-03-18 NOTE — ED Provider Notes (Signed)
Patient cleared by psychiatry.  Stable for discharge.  Continue to denies suicidal thoughts.   Pricilla Loveless, MD 03/18/22 1436
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
## 2022-03-18 NOTE — ED Notes (Signed)
Bus pass given to pt

## 2022-03-18 NOTE — ED Notes (Signed)
Pt called out is sitting up eating [place] sandwich in the bed called me in to see whats the next plan.  I explained that the physicians are making rounds and seeing other pts at this time but will get to her.  She now is saying that "I am about to go ape shit in here in a min"

## 2022-04-11 ENCOUNTER — Encounter (HOSPITAL_BASED_OUTPATIENT_CLINIC_OR_DEPARTMENT_OTHER): Payer: Self-pay | Admitting: Emergency Medicine

## 2022-04-11 ENCOUNTER — Emergency Department (HOSPITAL_BASED_OUTPATIENT_CLINIC_OR_DEPARTMENT_OTHER)
Admission: EM | Admit: 2022-04-11 | Discharge: 2022-04-11 | Disposition: A | Discharge status: Home / Self Care | Payer: Medicaid Other | Attending: Emergency Medicine | Admitting: Emergency Medicine

## 2022-04-11 ENCOUNTER — Other Ambulatory Visit: Payer: Self-pay

## 2022-04-11 DIAGNOSIS — R519 Headache, unspecified: Secondary | ICD-10-CM | POA: Insufficient documentation

## 2022-04-11 DIAGNOSIS — R112 Nausea with vomiting, unspecified: Secondary | ICD-10-CM

## 2022-04-11 DIAGNOSIS — Z3A28 28 weeks gestation of pregnancy: Secondary | ICD-10-CM | POA: Diagnosis not present

## 2022-04-11 DIAGNOSIS — O26892 Other specified pregnancy related conditions, second trimester: Secondary | ICD-10-CM | POA: Insufficient documentation

## 2022-04-11 DIAGNOSIS — O219 Vomiting of pregnancy, unspecified: Secondary | ICD-10-CM | POA: Diagnosis not present

## 2022-04-11 LAB — CBC WITH DIFFERENTIAL/PLATELET
Abs Immature Granulocytes: 0.04 10*3/uL (ref 0.00–0.07)
Basophils Absolute: 0 10*3/uL (ref 0.0–0.1)
Basophils Relative: 0 %
Eosinophils Absolute: 0.1 10*3/uL (ref 0.0–0.5)
Eosinophils Relative: 1 %
HCT: 29.9 % — ABNORMAL LOW (ref 36.0–46.0)
Hemoglobin: 10 g/dL — ABNORMAL LOW (ref 12.0–15.0)
Immature Granulocytes: 1 %
Lymphocytes Relative: 21 %
Lymphs Abs: 1.3 10*3/uL (ref 0.7–4.0)
MCH: 27.9 pg (ref 26.0–34.0)
MCHC: 33.4 g/dL (ref 30.0–36.0)
MCV: 83.3 fL (ref 80.0–100.0)
Monocytes Absolute: 0.5 10*3/uL (ref 0.1–1.0)
Monocytes Relative: 7 %
Neutro Abs: 4.4 10*3/uL (ref 1.7–7.7)
Neutrophils Relative %: 70 %
Platelets: 169 10*3/uL (ref 150–400)
RBC: 3.59 MIL/uL — ABNORMAL LOW (ref 3.87–5.11)
RDW: 12.9 % (ref 11.5–15.5)
WBC: 6.4 10*3/uL (ref 4.0–10.5)
nRBC: 0 % (ref 0.0–0.2)

## 2022-04-11 LAB — COMPREHENSIVE METABOLIC PANEL
ALT: 10 U/L (ref 0–44)
AST: 17 U/L (ref 15–41)
Albumin: 3.8 g/dL (ref 3.5–5.0)
Alkaline Phosphatase: 63 U/L (ref 38–126)
Anion gap: 8 (ref 5–15)
BUN: 5 mg/dL — ABNORMAL LOW (ref 6–20)
CO2: 23 mmol/L (ref 22–32)
Calcium: 8.6 mg/dL — ABNORMAL LOW (ref 8.9–10.3)
Chloride: 101 mmol/L (ref 98–111)
Creatinine, Ser: 0.62 mg/dL (ref 0.44–1.00)
GFR, Estimated: >60 mL/min (ref >60)
Glucose, Bld: 76 mg/dL (ref 70–99)
Potassium: 3.7 mmol/L (ref 3.5–5.1)
Sodium: 132 mmol/L — ABNORMAL LOW (ref 135–145)
Total Bilirubin: 0.4 mg/dL (ref 0.3–1.2)
Total Protein: 7.1 g/dL (ref 6.5–8.1)

## 2022-04-11 LAB — URINALYSIS, ROUTINE W REFLEX MICROSCOPIC
Bilirubin Urine: NEGATIVE
Glucose, UA: NEGATIVE mg/dL
Hgb urine dipstick: NEGATIVE
Leukocytes,Ua: NEGATIVE
Nitrite: NEGATIVE
Specific Gravity, Urine: 1.02 (ref 1.005–1.030)
pH: 7 (ref 5.0–8.0)

## 2022-04-11 LAB — PROTEIN / CREATININE RATIO, URINE
Creatinine, Urine: 205 mg/dL
Protein Creatinine Ratio: 0.07 mg/mg{Cre} (ref 0.00–0.15)
Total Protein, Urine: 14 mg/dL

## 2022-04-11 LAB — LIPASE, BLOOD: Lipase: 19 U/L (ref 11–51)

## 2022-04-11 MED ORDER — ONDANSETRON HCL 4 MG/2ML IJ SOLN
4.0000 mg | Freq: Once | INTRAMUSCULAR | Status: AC
Start: 2022-04-11 — End: 2022-04-11
  Administered 2022-04-11: 4 mg via INTRAVENOUS
  Filled 2022-04-11: qty 2

## 2022-04-11 MED ORDER — SODIUM CHLORIDE 0.9 % IV BOLUS
1000.0000 mL | Freq: Once | INTRAVENOUS | Status: AC
  Administered 2022-04-11: 1000 mL via INTRAVENOUS

## 2022-04-11 MED ORDER — ONDANSETRON 4 MG PO TBDP: 4.0000 mg | ORAL_TABLET | Freq: Three times a day (TID) | ORAL | 0 refills | Status: DC | PRN

## 2022-04-11 MED ORDER — SODIUM CHLORIDE 0.9 % IV SOLN: INTRAVENOUS | Status: DC

## 2022-04-11 MED ORDER — ACETAMINOPHEN-CAFFEINE 500-65 MG PO TABS: 1.0000 | ORAL_TABLET | Freq: Once | ORAL | Status: DC

## 2022-04-11 NOTE — Progress Notes (Signed)
Spoke with Dr. Deretha Emory. He has seen Dr. Mont Dutton message and they have added on the protein creatine ratio.

## 2022-04-11 NOTE — Discharge Instructions (Signed)
Take the Zofran as directed.  Make an appointment follow-up with your OB/GYN.  Return for any new or worse symptoms.  Fetal monitoring here today without any concerns no concerns for preeclampsia.

## 2022-04-11 NOTE — ED Triage Notes (Signed)
Throbbing headache, feels nauseated that feels different than earlier in pregnancy. This has been going on for 3 days, [redacted] weeks pregnant. She has not called her OB.

## 2022-04-11 NOTE — Progress Notes (Signed)
Spoke with pt's RN and asked to add a protein creatine ratio. I was able to speak to the pt. She denies any swelling except for her feet. No swelling of her face or hands. She denies epigastric pain and seeing spots before her eyes.

## 2022-04-11 NOTE — Progress Notes (Signed)
Received a call from [facility] ED. Pt is a G1P0 at 28 2/[redacted] weeks gestation presenting with complaints of a headache that she has had for 3 days. Also c/o nausea. Pt denies vaginal bleeding or leaking of fluid.

## 2022-04-11 NOTE — ED Provider Notes (Signed)
[facility] EMERGENCY DEPT Provider Note   CSN: [id number] Arrival date & time: 04/11/22  2505     History  Chief Complaint  Patient presents with   Headache    Lori [name] is a 21 y.o. female.  Patient [redacted] weeks pregnant gravida 1 para 0 due date is November 24.  Patient is followed by OB/GYN in the [facility] area and OB/GYN for [facility].  No complications with the pregnancy she dislikes being followed by both.  Patient states she has had a 3-day history of some headache and some nausea and vomiting.  She had problems with nausea vomiting the beginning of the pregnancy.  She had a history of headaches that have been migraine like prior to the pregnancy and had some trouble early on in pregnancy with them but none recently.  No vaginal bleeding no contractions.  Past medical history otherwise noncontributory.       Home Medications Prior to Admission medications   Medication Sig Start Date End Date Taking? Authorizing Provider  ondansetron (ZOFRAN-ODT) 4 MG disintegrating tablet Take 1 tablet (4 mg total) by mouth every 8 (eight) hours as needed for nausea or vomiting. 04/11/22  Yes Vanetta Mulders, MD      Allergies    Patient has no known allergies.    Review of Systems   Review of Systems  Constitutional:  Negative for chills and fever.  HENT:  Negative for ear pain and sore throat.   Eyes:  Negative for pain and visual disturbance.  Respiratory:  Negative for cough and shortness of breath.   Cardiovascular:  Negative for chest pain and palpitations.  Gastrointestinal:  Positive for nausea and vomiting. Negative for abdominal pain.  Genitourinary:  Negative for dysuria and hematuria.  Musculoskeletal:  Negative for arthralgias and back pain.  Skin:  Negative for color change and rash.  Neurological:  Positive for headaches. Negative for seizures and syncope.  All other systems reviewed and are negative.   Physical Exam Updated Vital  Signs BP 128/65   Pulse 62   Temp 98.3 F (36.8 C) (Oral)   Resp 18   LMP 04/09/2021 (Exact Date) Comment: Pt states she found out she is pregnant 3 days ago.  SpO2 100%  Physical Exam Vitals and nursing note reviewed.  Constitutional:      General: She is not in acute distress.    Appearance: She is well-developed. She is not ill-appearing, toxic-appearing or diaphoretic.  HENT:     Head: Normocephalic and atraumatic.  Eyes:     Extraocular Movements: Extraocular movements intact.     Conjunctiva/sclera: Conjunctivae normal.     Pupils: Pupils are equal, round, and reactive to light.  Cardiovascular:     Rate and Rhythm: Normal rate and regular rhythm.     Heart sounds: No murmur heard. Pulmonary:     Effort: Pulmonary effort is normal. No respiratory distress.     Breath sounds: Normal breath sounds.  Abdominal:     General: There is distension.     Palpations: Abdomen is soft.     Tenderness: There is no abdominal tenderness.     Comments: Abdomen is gravid greater than 20 weeks.  Musculoskeletal:        General: No swelling.     Cervical back: Normal range of motion and neck supple. No rigidity.  Skin:    General: Skin is warm and dry.     Capillary Refill: Capillary refill takes less than 2 seconds.  Neurological:     Mental Status: She is alert.     GCS: GCS eye subscore is 4. GCS verbal subscore is 5. GCS motor subscore is 6.     Cranial Nerves: No cranial nerve deficit.     Sensory: No sensory deficit.     Motor: No weakness.  Psychiatric:        Mood and Affect: Mood normal.     ED Results / Procedures / Treatments   Labs (all labs ordered are listed, but only abnormal results are displayed) Labs Reviewed  COMPREHENSIVE METABOLIC PANEL - Abnormal; Notable for the following components:      Result Value   Sodium 132 (*)    BUN 5 (*)    Calcium 8.6 (*)    All other components within normal limits  CBC WITH DIFFERENTIAL/PLATELET - Abnormal; Notable for  the following components:   RBC 3.59 (*)    Hemoglobin 10.0 (*)    HCT 29.9 (*)    All other components within normal limits  URINALYSIS, ROUTINE W REFLEX MICROSCOPIC - Abnormal; Notable for the following components:   Ketones, ur TRACE (*)    Protein, ur TRACE (*)    All other components within normal limits  LIPASE, BLOOD  PROTEIN / CREATININE RATIO, URINE    EKG None  Radiology No results found.  Procedures Procedures    Medications Ordered in ED Medications  0.9 %  sodium chloride infusion (has no administration in time range)  acetaminophen-caffeine (EXCEDRIN TENSION HEADACHE) 500-65 MG per tablet 1 tablet (has no administration in time range)  sodium chloride 0.9 % bolus 1,000 mL (1,000 mLs Intravenous New Bag/Given 04/11/22 1131)  ondansetron (ZOFRAN) injection 4 mg (4 mg Intravenous Given 04/11/22 1129)    ED Course/ Medical Decision Making/ A&P                           Medical Decision Making Amount and/or Complexity of Data Reviewed Labs: ordered.  Risk OTC drugs. Prescription drug management.   Patient monitored by L&D.  No abnormality seen.  They had cleared her for any concerns for preeclampsia.  Electrolytes sodium 132 patient received a liter of normal saline here renal functions normal GFR greater than 60 potassium normal lipase normal white blood cell count 6.4 hemoglobin 10.0 urinalysis negative for any concerns for infection.  Also on electrolytes no evidence of any significant dehydration CO2 was 23.  Patient received a liter of fluid and Zofran feels much better.  OB/GYN recommended Excedrin Tylenol and caffeine but we do not have that here.  Patient could have received portions of the [place] cocktail but with the fluid and Zofran she felt better she decided she did not want anything additional.  We will give a short course of Zofran to help with the nausea vomiting have her follow-up with her OB.  Patient cleared for discharge home.  Work note  provided. Final Clinical Impression(s) / ED Diagnoses Final diagnoses:  Nausea and vomiting, unspecified vomiting type  Acute intractable headache, unspecified headache type  [redacted] weeks gestation of pregnancy    Rx / DC Orders ED Discharge Orders          Ordered    ondansetron (ZOFRAN-ODT) 4 MG disintegrating tablet  Every 8 hours PRN        04/11/22 1332              Vanetta Mulders, MD 04/11/22 1339

## 2022-04-11 NOTE — Progress Notes (Signed)
Spoke with pt's RN and asked him to adjust her fetal monitor because the tracing is intermittent.

## 2022-04-11 NOTE — Progress Notes (Signed)
Spoke with pt's RN. FHR is intermittent tracing. Says he will adjust pt's monitor.

## 2022-04-11 NOTE — Progress Notes (Signed)
Spoke with Dr. Macon Large concerning the pt and her symptoms. Says she will text Dr. Deretha Emory with some suggestions for her treatment.

## 2022-06-08 ENCOUNTER — Telehealth (HOSPITAL_COMMUNITY): Payer: Self-pay

## 2022-06-08 NOTE — Telephone Encounter (Signed)
Preadmission screening 

## 2022-06-09 LAB — OB RESULTS CONSOLE GBS: GBS: NEGATIVE

## 2022-06-13 ENCOUNTER — Inpatient Hospital Stay (EMERGENCY_DEPARTMENT_HOSPITAL)
Admission: AD | Admit: 2022-06-13 | Discharge: 2022-06-13 | Disposition: A | Discharge status: Home / Self Care | Payer: Medicaid Other | Source: Home / Self Care | Attending: Obstetrics and Gynecology | Admitting: Obstetrics and Gynecology

## 2022-06-13 ENCOUNTER — Encounter (HOSPITAL_COMMUNITY): Payer: Self-pay | Admitting: Obstetrics and Gynecology

## 2022-06-13 ENCOUNTER — Other Ambulatory Visit: Payer: Self-pay

## 2022-06-13 DIAGNOSIS — O471 False labor at or after 37 completed weeks of gestation: Secondary | ICD-10-CM | POA: Insufficient documentation

## 2022-06-13 DIAGNOSIS — O4693 Antepartum hemorrhage, unspecified, third trimester: Secondary | ICD-10-CM | POA: Insufficient documentation

## 2022-06-13 DIAGNOSIS — O479 False labor, unspecified: Secondary | ICD-10-CM

## 2022-06-13 DIAGNOSIS — Z3A37 37 weeks gestation of pregnancy: Secondary | ICD-10-CM | POA: Insufficient documentation

## 2022-06-13 HISTORY — DX: Other specified health status: Z78.9

## 2022-06-13 NOTE — MAU Note (Signed)
Lori [name] is a 21 y.o. at [redacted]w[redacted]d here in MAU reporting: she has [employer] ctxs and VB.  States she had VB this morning after losing some of her mucus plug.  Denies recent intercourse.  States VB was initially pinkish then became bright red. Endorses +FM, denies LOF. LMP: NA Onset of complaint: today Pain score: 8 Vitals:   06/13/22 1018  BP: 125/81  Pulse: 86  Resp: 18  Temp: 98.1 F (36.7 C)  SpO2: 100%     FHT:135 bpm Lab orders placed from triage:   None

## 2022-06-13 NOTE — MAU Provider Note (Signed)
History     CSN: [id number]  Arrival date and time: 06/13/22 7341   Event Date/Time   First Provider Initiated Contact with Patient 06/13/22 1031      Chief Complaint  Patient presents with   Vaginal Bleeding   Contractions   Patient presenting with contractions and vaginal bleeding since last night.  Unsure of how frequent the contractions are but she noticed the bleeding last night she had a large clump of mucus followed by bright red blood on her pad.  Not a large amount of blood but enough that it was visible on the pad.  She brought pictures which are in the media tab.  Reports good fetal movement.  Denies any gush of fluid.  Denies any other symptoms.    OB History     Gravida  1   Para      Term      Preterm      AB      Lori [name]         SAB      IAB      Ectopic      Multiple      Live Births              Past Medical History:  Diagnosis Date   Medical history non-contributory     Past Surgical History:  Procedure Laterality Date   NO PAST SURGERIES      Family History  Problem Relation Age of Onset   Diabetes Mother    Varicose Veins Father     Social History   Tobacco Use   Smoking status: Never   Smokeless tobacco: Never  Vaping Use   Vaping Use: Former  Substance Use Topics   Alcohol use: Not Currently   Drug use: Not Currently    Types: Marijuana    Allergies: No Known Allergies  Medications Prior to Admission  Medication Sig Dispense Refill Last Dose   Prenatal Vit-Fe Fumarate-FA (MULTIVITAMIN-PRENATAL) 27-0.8 MG TABS tablet Take 1 tablet by mouth daily at 12 noon.   06/12/2022   ondansetron (ZOFRAN-ODT) 4 MG disintegrating tablet Take 1 tablet (4 mg total) by mouth every 8 (eight) hours as needed for nausea or vomiting. 12 tablet 0 More than a month    Review of Systems  Respiratory:  Negative for shortness of breath.   Cardiovascular:  Negative for chest pain.  Gastrointestinal:  Positive for abdominal pain (with  contractions).  Genitourinary:  Positive for vaginal bleeding and vaginal discharge. Negative for vaginal pain.  All other systems reviewed and are negative.  Physical Exam   Blood pressure 130/66, pulse 98, temperature 98.1 F (36.7 C), temperature source Oral, resp. rate 18, last menstrual period 04/09/2021, SpO2 98 %, unknown if currently breastfeeding.  Physical Exam Vitals reviewed.  Constitutional:      Appearance: Normal appearance.  HENT:     Nose: Nose normal.     Mouth/Throat:     Mouth: Mucous membranes are moist.  Eyes:     Extraocular Movements: Extraocular movements intact.  Cardiovascular:     Rate and Rhythm: Normal rate and regular rhythm.  Pulmonary:     Effort: Pulmonary effort is normal.  Abdominal:     Comments: Gravid   Genitourinary:    Comments: Cervical exam 1/80-90/-1.  Speculum exam showing brown blood in vaginal vault but no bright red blood from os. Musculoskeletal:        General: Normal range of motion.  Cervical back: Normal range of motion.  Skin:    General: Skin is warm.     Capillary Refill: Capillary refill takes less than 2 seconds.  Neurological:     Mental Status: She is alert.  Psychiatric:        Mood and Affect: Mood normal.     MAU Course  Procedures  MDM NST being performed category 1 strip Initial cervical exam 1/80-90/-1, will repeat in 1 hour    Assessment and Plan  Patient is a 21 year old G1 presenting at 37 weeks 2 days for vaginal bleeding as well as contractions.  Speculum exam showing no bright red blood in vaginal vault.  Patient did have pictures of bright red blood this morning.  Initial cervical exam 1/80/-1.  Repeat 1 hour later 1.5/80/-1.  Repeat 1 hour later stable.  Repeat cervical exams showing no brown blood or bright red blood on glove.  Contractions every 6 to 8 minutes.  Feel that bleeding likely due to cervical change but patient may be in early latent labor.  Fetal heart tracing reassuring  throughout evaluation with category 1 strip.  Will discharge home with strict return precautions.  Patient is agreeable to this.  Lori Lori [name] 06/13/2022, 1:14 PM

## 2022-06-14 ENCOUNTER — Other Ambulatory Visit: Payer: Self-pay

## 2022-06-14 ENCOUNTER — Inpatient Hospital Stay (HOSPITAL_COMMUNITY): Payer: Medicaid Other | Admitting: Anesthesiology

## 2022-06-14 ENCOUNTER — Inpatient Hospital Stay (HOSPITAL_COMMUNITY)
Admission: AD | Admit: 2022-06-14 | Discharge: 2022-06-15 | DRG: 806 | Disposition: A | Discharge status: Home / Self Care | Payer: Medicaid Other | Attending: Obstetrics and Gynecology | Admitting: Obstetrics and Gynecology

## 2022-06-14 ENCOUNTER — Encounter (HOSPITAL_COMMUNITY): Payer: Self-pay | Admitting: Obstetrics and Gynecology

## 2022-06-14 DIAGNOSIS — Z3A37 37 weeks gestation of pregnancy: Secondary | ICD-10-CM

## 2022-06-14 DIAGNOSIS — O99324 Drug use complicating childbirth: Secondary | ICD-10-CM | POA: Diagnosis present

## 2022-06-14 DIAGNOSIS — O26893 Other specified pregnancy related conditions, third trimester: Secondary | ICD-10-CM | POA: Diagnosis present

## 2022-06-14 DIAGNOSIS — F1210 Cannabis abuse, uncomplicated: Secondary | ICD-10-CM | POA: Diagnosis present

## 2022-06-14 DIAGNOSIS — O4202 Full-term premature rupture of membranes, onset of labor within 24 hours of rupture: Secondary | ICD-10-CM | POA: Diagnosis not present

## 2022-06-14 LAB — TYPE AND SCREEN
ABO/RH(D): A POS
Antibody Screen: NEGATIVE

## 2022-06-14 LAB — RPR: RPR Ser Ql: NONREACTIVE

## 2022-06-14 LAB — CBC
HCT: 34 % — ABNORMAL LOW (ref 36.0–46.0)
Hemoglobin: 11.5 g/dL — ABNORMAL LOW (ref 12.0–15.0)
MCH: 28.4 pg (ref 26.0–34.0)
MCHC: 33.8 g/dL (ref 30.0–36.0)
MCV: 84 fL (ref 80.0–100.0)
Platelets: 190 10*3/uL (ref 150–400)
RBC: 4.05 MIL/uL (ref 3.87–5.11)
RDW: 14.5 % (ref 11.5–15.5)
WBC: 9.7 10*3/uL (ref 4.0–10.5)
nRBC: 0 % (ref 0.0–0.2)

## 2022-06-14 MED ORDER — ONDANSETRON HCL 4 MG/2ML IJ SOLN: 4.0000 mg | INTRAMUSCULAR | Status: DC | PRN

## 2022-06-14 MED ORDER — COCONUT OIL OIL: 1.0000 | TOPICAL_OIL | Status: DC | PRN

## 2022-06-14 MED ORDER — EPHEDRINE 5 MG/ML INJ: 10.0000 mg | INTRAVENOUS | Status: DC | PRN

## 2022-06-14 MED ORDER — MISOPROSTOL 200 MCG PO TABS
1000.0000 ug | ORAL_TABLET | Freq: Once | ORAL | Status: AC
  Administered 2022-06-14: 1000 ug via RECTAL

## 2022-06-14 MED ORDER — OXYCODONE-ACETAMINOPHEN 5-325 MG PO TABS: 1.0000 | ORAL_TABLET | ORAL | Status: DC | PRN

## 2022-06-14 MED ORDER — PRENATAL MULTIVITAMIN CH
1.0000 | ORAL_TABLET | Freq: Every day | ORAL | Status: DC
  Administered 2022-06-14 – 2022-06-15 (×2): 1 via ORAL
  Filled 2022-06-14 (×2): qty 1

## 2022-06-14 MED ORDER — MISOPROSTOL 200 MCG PO TABS: 1000.0000 ug | ORAL_TABLET | Freq: Once | ORAL | Status: DC

## 2022-06-14 MED ORDER — LACTATED RINGERS IV SOLN
INTRAVENOUS | Status: DC
  Administered 2022-06-14: 1000 mL via INTRAVENOUS

## 2022-06-14 MED ORDER — DIBUCAINE (PERIANAL) 1 % EX OINT: 1.0000 | TOPICAL_OINTMENT | CUTANEOUS | Status: DC | PRN

## 2022-06-14 MED ORDER — MEASLES, MUMPS & RUBELLA VAC IJ SOLR: 0.5000 mL | Freq: Once | INTRAMUSCULAR | Status: DC

## 2022-06-14 MED ORDER — IBUPROFEN 600 MG PO TABS
600.0000 mg | ORAL_TABLET | Freq: Four times a day (QID) | ORAL | Status: DC
  Administered 2022-06-14 – 2022-06-15 (×6): 600 mg via ORAL
  Filled 2022-06-14 (×6): qty 1

## 2022-06-14 MED ORDER — TETANUS-DIPHTH-ACELL PERTUSSIS 5-2.5-18.5 LF-MCG/0.5 IM SUSY: 0.5000 mL | PREFILLED_SYRINGE | Freq: Once | INTRAMUSCULAR | Status: DC

## 2022-06-14 MED ORDER — ACETAMINOPHEN 325 MG PO TABS: 650.0000 mg | ORAL_TABLET | ORAL | Status: DC | PRN

## 2022-06-14 MED ORDER — WITCH HAZEL-GLYCERIN EX PADS: 1.0000 | MEDICATED_PAD | CUTANEOUS | Status: DC | PRN

## 2022-06-14 MED ORDER — LACTATED RINGERS IV SOLN: 500.0000 mL | INTRAVENOUS | Status: DC | PRN

## 2022-06-14 MED ORDER — LIDOCAINE HCL (PF) 1 % IJ SOLN: 30.0000 mL | INTRAMUSCULAR | Status: DC | PRN

## 2022-06-14 MED ORDER — MISOPROSTOL 200 MCG PO TABS
ORAL_TABLET | ORAL | Status: AC
  Filled 2022-06-14: qty 5

## 2022-06-14 MED ORDER — LIDOCAINE-EPINEPHRINE (PF) 2 %-1:200000 IJ SOLN
INTRAMUSCULAR | Status: DC | PRN
  Administered 2022-06-14: 5 mL via EPIDURAL

## 2022-06-14 MED ORDER — DIPHENHYDRAMINE HCL 50 MG/ML IJ SOLN: 12.5000 mg | INTRAMUSCULAR | Status: DC | PRN

## 2022-06-14 MED ORDER — OXYTOCIN-SODIUM CHLORIDE 30-0.9 UT/500ML-% IV SOLN
2.5000 [IU]/h | INTRAVENOUS | Status: DC
  Administered 2022-06-14: 2.5 [IU]/h via INTRAVENOUS
  Filled 2022-06-14: qty 500

## 2022-06-14 MED ORDER — BENZOCAINE-MENTHOL 20-0.5 % EX AERO
1.0000 | INHALATION_SPRAY | CUTANEOUS | Status: DC | PRN
  Filled 2022-06-14: qty 56

## 2022-06-14 MED ORDER — FLEET ENEMA 7-19 GM/118ML RE ENEM: 1.0000 | ENEMA | RECTAL | Status: DC | PRN

## 2022-06-14 MED ORDER — PHENYLEPHRINE 80 MCG/ML (10ML) SYRINGE FOR IV PUSH (FOR BLOOD PRESSURE SUPPORT): 80.0000 ug | PREFILLED_SYRINGE | INTRAVENOUS | Status: DC | PRN

## 2022-06-14 MED ORDER — SIMETHICONE 80 MG PO CHEW: 80.0000 mg | CHEWABLE_TABLET | ORAL | Status: DC | PRN

## 2022-06-14 MED ORDER — SOD CITRATE-CITRIC ACID 500-334 MG/5ML PO SOLN: 30.0000 mL | ORAL | Status: DC | PRN

## 2022-06-14 MED ORDER — FENTANYL-BUPIVACAINE-NACL 0.5-0.125-0.9 MG/250ML-% EP SOLN
12.0000 mL/h | EPIDURAL | Status: DC | PRN
  Administered 2022-06-14: 12 mL/h via EPIDURAL
  Filled 2022-06-14: qty 250

## 2022-06-14 MED ORDER — ONDANSETRON HCL 4 MG PO TABS: 4.0000 mg | ORAL_TABLET | ORAL | Status: DC | PRN

## 2022-06-14 MED ORDER — PHENYLEPHRINE 80 MCG/ML (10ML) SYRINGE FOR IV PUSH (FOR BLOOD PRESSURE SUPPORT)
80.0000 ug | PREFILLED_SYRINGE | INTRAVENOUS | Status: DC | PRN
  Filled 2022-06-14: qty 10

## 2022-06-14 MED ORDER — DIPHENHYDRAMINE HCL 25 MG PO CAPS: 25.0000 mg | ORAL_CAPSULE | Freq: Four times a day (QID) | ORAL | Status: DC | PRN

## 2022-06-14 MED ORDER — MAGNESIUM HYDROXIDE 400 MG/5ML PO SUSP: 30.0000 mL | ORAL | Status: DC | PRN

## 2022-06-14 MED ORDER — OXYTOCIN BOLUS FROM INFUSION
333.0000 mL | Freq: Once | INTRAVENOUS | Status: AC
  Administered 2022-06-14: 333 mL via INTRAVENOUS

## 2022-06-14 MED ORDER — IBUPROFEN 600 MG PO TABS: 600.0000 mg | ORAL_TABLET | Freq: Four times a day (QID) | ORAL | Status: DC | PRN

## 2022-06-14 MED ORDER — ONDANSETRON HCL 4 MG/2ML IJ SOLN
4.0000 mg | Freq: Four times a day (QID) | INTRAMUSCULAR | Status: DC | PRN
  Administered 2022-06-14: 4 mg via INTRAVENOUS
  Filled 2022-06-14: qty 2

## 2022-06-14 MED ORDER — FENTANYL CITRATE (PF) 100 MCG/2ML IJ SOLN: 50.0000 ug | INTRAMUSCULAR | Status: DC | PRN

## 2022-06-14 MED ORDER — OXYCODONE-ACETAMINOPHEN 5-325 MG PO TABS: 2.0000 | ORAL_TABLET | ORAL | Status: DC | PRN

## 2022-06-14 MED ORDER — LACTATED RINGERS IV SOLN: 500.0000 mL | Freq: Once | INTRAVENOUS | Status: DC

## 2022-06-14 NOTE — MAU Note (Signed)
.  Lori [name] is a 21 y.o. at [redacted]w[redacted]d here in MAU reporting: been having ctx since 2300 3-5 minutes apart. Reports gushes of blood when she uses the bathroom or has been sitting for a long period of time. +FM. Reports nausea from the pain.   Onset of complaint: 2300 Pain score: 8 Vitals:   06/14/22 0147  BP: 133/70  Pulse: 88  Resp: 20  Temp: 98.1 F (36.7 C)  SpO2: 99%     FHT:136 Lab orders placed from triage:  labor eval

## 2022-06-14 NOTE — MAU Note (Signed)
Monitors off - pt to LD via stretcher. Stable for transfer

## 2022-06-14 NOTE — Lactation Note (Signed)
This note was copied from a baby's chart. Lactation Consultation Note  Patient Name: Lori [name] [id number] Date: 06/14/2022 Reason for consult: L&D Initial assessment;Primapara;Early term 37-38.6wks Age:22 hours Assisted baby in cradle position to latch. Took a couple of times but baby latched well. Mom denies painful latch. Mom asked several good questions, LC answered them. Encouraged STS while BF. Mom wanted to know if the baby was getting anything from the breast. Hand expression demonstrated showing colostrum. Mom happy about that. Mom will be f/u on MBU.  Maternal Data Has patient been taught Hand Expression?: Yes Does the patient have breastfeeding experience prior to this delivery?: No  Feeding    LATCH Score Latch: Grasps breast easily, tongue down, lips flanged, rhythmical sucking.  Audible Swallowing: None  Type of Nipple: Everted at rest and after stimulation  Comfort (Breast/Nipple): Soft / non-tender  Hold (Positioning): Assistance needed to correctly position infant at breast and maintain latch.  LATCH Score: 7   Lactation Tools Discussed/Used    Interventions Interventions: Adjust position;Assisted with latch;Support pillows;Skin to skin;Position options;Breast massage;Hand express;Breast compression  Discharge    Consult Status Consult Status: Follow-up from L&D Date: 06/14/22 Follow-up type: In-patient    Theodoro Kalata 06/14/2022, 5:56 AM

## 2022-06-14 NOTE — H&P (Signed)
Lori [name] is a 21 y.o. female presenting for labor and delivery . OB History     Gravida  1   Para  1   Term  1   Preterm      AB      Living  1      SAB      IAB      Ectopic      Multiple  0   Live Births  1          Past Medical History:  Diagnosis Date   Medical history non-contributory    Past Surgical History:  Procedure Laterality Date   NO PAST SURGERIES     Family History: family history includes Diabetes in her mother; Varicose Veins in her father. Social History:  reports that she has never smoked. She has never used smokeless tobacco. She reports that she does not currently use alcohol. She reports that she does not currently use drugs after having used the following drugs: Marijuana.     Maternal Diabetes: No Genetic Screening: Normal Maternal Ultrasounds/Referrals: Normal Fetal Ultrasounds or other Referrals:  None Maternal Substance Abuse:  THC Significant Maternal Medications:  None Significant Maternal Lab Results:  Group B Strep negative Number of Prenatal Visits:greater than 3 verified prenatal visits Other Comments:  None  Review of Systems History Dilation: 10 Effacement (%): 90 Station: Plus 1, Plus 2 Exam by:: Lori Salines RN Blood pressure 116/77, pulse 73, temperature 98.9 F (37.2 C), temperature source Oral, resp. rate 18, height 5\' 7"  (1.702 m), weight 87.5 kg, last menstrual period 04/09/2021, SpO2 99 %, unknown if currently breastfeeding. Exam Physical Exam  Physical Examination: Abdomen - soft, nontender, nondistended, no masses or organomegaly Fundus firm Pelvic - small amount of bleeding seen.   Prenatal labs: ABO, Rh: --/--/A POS (11/06 1610) Antibody: NEG (11/06 0332) Rubella: Immune (04/27 0000) RPR: NON REACTIVE (11/06 0230)  HBsAg: Negative (04/27 0000)  HIV: Non-reactive (04/27 0000)  GBS:     Assessment/Plan: S/p SVD baby boy Pt had brisk bleeding.  Corrected with evacuation of clot and fundal   massage Cytotec 1000 micrograms per rectum Monitor closely   Lori [name] 06/14/2022, 8:04 AM

## 2022-06-14 NOTE — Anesthesia Preprocedure Evaluation (Signed)
Anesthesia Evaluation  Patient identified by MRN, date of birth, ID band Patient awake    Reviewed: Allergy & Precautions, NPO status , Patient's Chart, lab work & pertinent test results  Airway Mallampati: II  TM Distance: >3 FB Neck ROM: Full    Dental no notable dental hx.    Pulmonary neg pulmonary ROS   Pulmonary exam normal breath sounds clear to auscultation       Cardiovascular negative cardio ROS Normal cardiovascular exam Rhythm:Regular Rate:Normal     Neuro/Psych negative neurological ROS  negative psych ROS   GI/Hepatic negative GI ROS, Neg liver ROS,,,  Endo/Other  negative endocrine ROS    Renal/GU negative Renal ROS  negative genitourinary   Musculoskeletal negative musculoskeletal ROS (+)    Abdominal   Peds  Hematology negative hematology ROS (+)   Anesthesia Other Findings Presents in labor  Reproductive/Obstetrics (+) Pregnancy                              Anesthesia Physical Anesthesia Plan  ASA: 2  Anesthesia Plan: Epidural   Post-op Pain Management:    Induction:   PONV Risk Score and Plan: Treatment may vary due to age or medical condition  Airway Management Planned: Natural Airway  Additional Equipment:   Intra-op Plan:   Post-operative Plan:   Informed Consent: I have reviewed the patients History and Physical, chart, labs and discussed the procedure including the risks, benefits and alternatives for the proposed anesthesia with the patient or authorized representative who has indicated his/her understanding and acceptance.       Plan Discussed with: Anesthesiologist  Anesthesia Plan Comments: (Patient identified. Risks, benefits, options discussed with patient including but not limited to bleeding, infection, nerve damage, paralysis, failed block, incomplete pain control, headache, blood pressure changes, nausea, vomiting, reactions to  medication, itching, and post partum back pain. Confirmed with bedside nurse the patient's most recent platelet count. Confirmed with the patient that they are not taking any anticoagulation, have any bleeding history or any family history of bleeding disorders. Patient expressed understanding and wishes to proceed. All questions were answered. )         Anesthesia Quick Evaluation  

## 2022-06-14 NOTE — Anesthesia Postprocedure Evaluation (Signed)
Anesthesia Post Note  Patient: Lori [name]  Procedure(s) Performed: AN AD Delta     Patient location during evaluation: Mother Baby Anesthesia Type: Epidural Level of consciousness: awake and alert Pain management: pain level controlled Vital Signs Assessment: post-procedure vital signs reviewed and stable Respiratory status: spontaneous breathing, nonlabored ventilation and respiratory function stable Cardiovascular status: stable Postop Assessment: no headache, epidural receding, adequate PO intake, no apparent nausea or vomiting, able to ambulate and patient able to bend at knees (complain of slight bach ache, responds well  to heat pads and motrin) Anesthetic complications: no   No notable events documented.  Last Vitals:  Vitals:   06/14/22 1310 06/14/22 1600  BP: 131/66 119/63  Pulse: 73 (!) 59  Resp: 18 18  Temp: 37.1 C 36.8 C  SpO2:  100%    Last Pain:  Vitals:   06/14/22 1600  TempSrc: Oral  PainSc: 0-No pain   Pain Goal:                   Talitha Givens

## 2022-06-14 NOTE — Lactation Note (Signed)
This note was copied from a baby's chart. Lactation Consultation Note  Patient Name: Lori [name] [id number] Date: 06/14/2022 Reason for consult: Initial assessment;Primapara;1st time breastfeeding;Early term 37-38.6wks;Breastfeeding assistance Age:22 hours Per mom has attempted to latch and the baby had been sleepy.  LC offered to check the diaper, noted to be dry.  Baby awake and rooting. LC with LD RN orientee offered to assist and  Mom receptive. BF basics reviewed and baby latched easily on the right breast , football with depth and swallows. Swallows increased with breast compressions and warm moist heat compress on the breast.  Per mom comfortable.  [place] resources provided.   Maternal Data Has patient been taught Hand Expression?: Yes (several drops prior to latching and afterwards)  Feeding Mother's Current Feeding Choice: Breast Milk  LATCH Score Latch: Grasps breast easily, tongue down, lips flanged, rhythmical sucking.  Audible Swallowing: Spontaneous and intermittent  Type of Nipple: Everted at rest and after stimulation  Comfort (Breast/Nipple): Soft / non-tender  Hold (Positioning): Assistance needed to correctly position infant at breast and maintain latch.  LATCH Score: 9   Lactation Tools Discussed/Used    Interventions Interventions: Breast feeding basics reviewed;Assisted with latch;Skin to skin;Breast massage;Hand express;Breast compression;Adjust position;Support pillows;Position options;Education;LC Services brochure  Discharge Pump: DEBP;Personal  Consult Status Consult Status: Follow-up Date: 06/15/22 Follow-up type: In-patient    [place] 06/14/2022, 12:08 PM

## 2022-06-14 NOTE — Anesthesia Procedure Notes (Addendum)
Epidural Patient location during procedure: OB Start time: 06/14/2022 3:00 AM End time: 06/14/2022 3:10 AM  Staffing Anesthesiologist: Freddrick March, MD Performed: anesthesiologist   Preanesthetic Checklist Completed: patient identified, IV checked, risks and benefits discussed, monitors and equipment checked, pre-op evaluation and timeout performed  Epidural Patient position: sitting Prep: DuraPrep and site prepped and draped Patient monitoring: continuous pulse ox, blood pressure, heart rate and cardiac monitor Approach: midline Location: L3-L4 Injection technique: LOR air  Needle:  Needle type: Tuohy  Needle gauge: 17 G Needle length: 9 cm Needle insertion depth: 5 cm Catheter type: closed end flexible Catheter size: 19 Gauge Catheter at skin depth: 10 cm Test dose: negative  Assessment Sensory level: T8 Events: blood not aspirated, injection not painful, no injection resistance, no paresthesia and negative IV test  Additional Notes Patient identified. Risks/Benefits/Options discussed with patient including but not limited to bleeding, infection, nerve damage, paralysis, failed block, incomplete pain control, headache, blood pressure changes, nausea, vomiting, reactions to medication both or allergic, itching and postpartum back pain. Confirmed with bedside nurse the patient's most recent platelet count. Confirmed with patient that they are not currently taking any anticoagulation, have any bleeding history or any family history of bleeding disorders. Patient expressed understanding and wished to proceed. All questions were answered. Sterile technique was used throughout the entire procedure. Please see nursing notes for vital signs. Test dose was given through epidural catheter and negative prior to continuing to dose epidural or start infusion. Warning signs of high block given to the patient including shortness of breath, tingling/numbness in hands, complete motor block,  or any concerning symptoms with instructions to call for help. Patient was given instructions on fall risk and not to get out of bed. All questions and concerns addressed with instructions to call with any issues or inadequate analgesia.  Reason for block:procedure for pain

## 2022-06-15 LAB — CBC
HCT: 28.2 % — ABNORMAL LOW (ref 36.0–46.0)
Hemoglobin: 9.7 g/dL — ABNORMAL LOW (ref 12.0–15.0)
MCH: 28.4 pg (ref 26.0–34.0)
MCHC: 34.4 g/dL (ref 30.0–36.0)
MCV: 82.7 fL (ref 80.0–100.0)
Platelets: 156 10*3/uL (ref 150–400)
RBC: 3.41 MIL/uL — ABNORMAL LOW (ref 3.87–5.11)
RDW: 14.7 % (ref 11.5–15.5)
WBC: 9.8 10*3/uL (ref 4.0–10.5)
nRBC: 0 % (ref 0.0–0.2)

## 2022-06-15 MED ORDER — DOCUSATE SODIUM 100 MG PO CAPS: 100.0000 mg | ORAL_CAPSULE | Freq: Two times a day (BID) | ORAL | 0 refills | Status: AC

## 2022-06-15 MED ORDER — IBUPROFEN 600 MG PO TABS: 600.0000 mg | ORAL_TABLET | Freq: Four times a day (QID) | ORAL | 0 refills | Status: AC | PRN

## 2022-06-15 NOTE — Progress Notes (Signed)
Post Partum Day 1 Vaginal Delivery  Subjective: no complaints, up ad lib, voiding, tolerating PO, and + flatus  Objective: Blood pressure 120/61, pulse (!) 54, temperature 98.3 F (36.8 C), temperature source Oral, resp. rate 18, height 5\' 7"  (1.702 m), weight 87.5 kg, last menstrual period 04/09/2021, SpO2 100 %, unknown if currently breastfeeding.  Physical Exam:  General: alert, cooperative, and no distress Lochia: appropriate Uterine Fundus: firm Incision: n/a DVT Evaluation: No evidence of DVT seen on physical exam.  Recent Labs    06/14/22 0230 06/15/22 0515  HGB 11.5* 9.7*  HCT 34.0* 28.2*    Assessment/Plan: Plan for discharge tomorrow, Breast and bottle feeding, Circumcision prior to discharge, and Contraception condoms.  Patient desires circumcision for infant. Discussed cosmetic procedure and R/B/A including, but not limited to infection, bleeding, damage to surrounding structures. Informed written and verbal consent obtained. Will proceed.    LOS: 1 day   Josefine Class, MD 06/15/2022, 10:10 AM
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
## 2022-06-15 NOTE — Lactation Note (Signed)
This note was copied from a baby's chart. Lactation Consultation Note  Patient Name: Lori [name] [id number] Date: 06/15/2022 Reason for consult: Follow-up assessment;Primapara;1st time breastfeeding;Early term 37-38.6wks;Infant weight loss (per mom baby fed prior to going for a circ. LC reviewed the doc flow sheets and updated per mom. LC reviewed BF D/C teaching and breast feeding basics. LC reassured mom the baby is doing well  BF and the doc flow sheets WNL for D/C.) Age:57 hours  Maternal Data    Feeding Mother's Current Feeding Choice: Breast Milk and Formula Nipple Type: Slow - flow  LATCH Score Latch: Grasps breast easily, tongue down, lips flanged, rhythmical sucking.  Audible Swallowing: Spontaneous and intermittent  Type of Nipple: Everted at rest and after stimulation  Comfort (Breast/Nipple): Soft / non-tender  Hold (Positioning): Assistance needed to correctly position infant at breast and maintain latch.  LATCH Score: 9   Lactation Tools Discussed/Used Tools: Pump Breast pump type: Manual Pump Education: Milk Storage;Setup, frequency, and cleaning;Other (comment) (hand pump was already in the room and LC reviewed the set up)  Interventions Interventions: Breast feeding basics reviewed;Hand pump;Education;LC Services brochure  Discharge Discharge Education: Engorgement and breast care;Warning signs for feeding baby;Outpatient recommendation;Outpatient Epic message sent;Other (comment) (mom aware she will receive a call) Pump: DEBP;Personal;Manual  Consult Status Consult Status: Complete Date: 06/15/22    Jerlyn Ly Joane Postel 06/15/2022, 11:06 AM

## 2022-06-15 NOTE — Discharge Summary (Signed)
Obstetric Discharge Summary Reason for Admission: onset of labor Prenatal Procedures: none Intrapartum Procedures: spontaneous vaginal delivery precipitous Postpartum Procedures: none Complications-Operative and Postpartum: none Hemoglobin  Date Value Ref Range Status  06/15/2022 9.7 (L) 12.0 - 15.0 g/dL Final   HCT  Date Value Ref Range Status  06/15/2022 28.2 (L) 36.0 - 46.0 % Final    Physical Exam:  General: alert, cooperative, and no distress Lochia: appropriate Uterine Fundus: firm Incision: n/a DVT Evaluation: No evidence of DVT seen on physical exam.  Discharge Diagnoses: Term Pregnancy-delivered  Discharge Information: Date: 06/15/2022 Activity: pelvic rest Diet: routine Medications: Ibuprofen and Colace Condition: stable Instructions: refer to practice specific booklet Discharge to: home  Follow-up [facility]. Schedule an appointment as soon as possible for a visit in 6 week(s).   Contact information: [address] [place] [address] [phone]                Newborn Data: Live born female  Birth Weight: 7 lb 3 oz (3260 g) APGAR: 38, 10  Newborn Delivery   Birth date/time: 06/14/2022 05:02:25 Delivery type: Vaginal, Spontaneous      Home with mother.  Josefine Class 06/15/2022, 4:57 PM

## 2022-06-23 ENCOUNTER — Telehealth (HOSPITAL_COMMUNITY): Payer: Self-pay | Admitting: *Deleted

## 2022-06-23 NOTE — Telephone Encounter (Signed)
Patient voiced no questions or concerns regarding her health at this time. EPDS=3. Patient voiced no questions or concerns regarding infant at this time. Patient reports infant sleeps in a bassinet on his back. RN reviewed ABCs of safe sleep. Patient verbalized understanding. Patient requested RN email information on hospital's virtual postpartum classes and support groups. Email sent. Deforest Hoyles, RN, 06/23/22,1943

## 2022-07-02 ENCOUNTER — Inpatient Hospital Stay (HOSPITAL_COMMUNITY): Admit: 2022-07-02 | Payer: Medicaid Other | Admitting: Obstetrics & Gynecology

## 2023-05-08 IMAGING — US US OB < 14 WEEKS - US OB TV
1 series · 15 of 28 positions shown · non-contrast
Comparison: None.

CLINICAL DATA: Vaginal bleeding



[Series 1: us ob < 14 weeks - us ob tv · 15 of 39 slices shown]
[im 1/39]
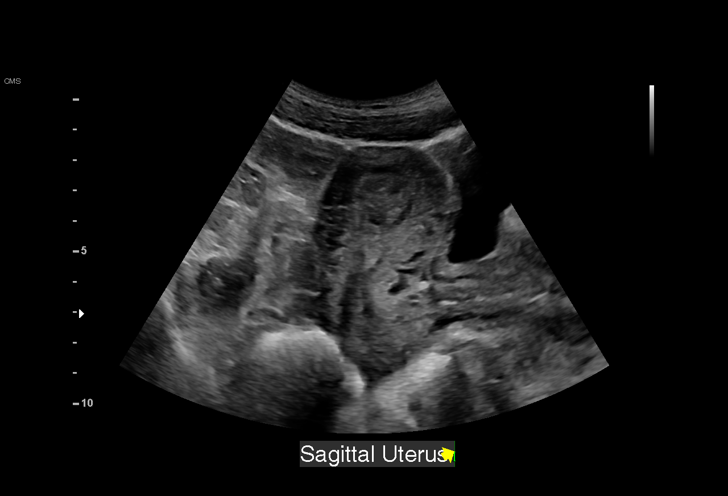
[im 3/39]
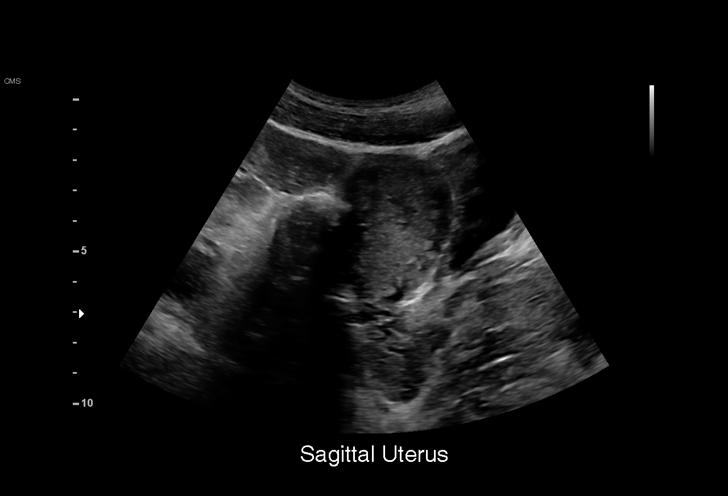
[im 6/39]
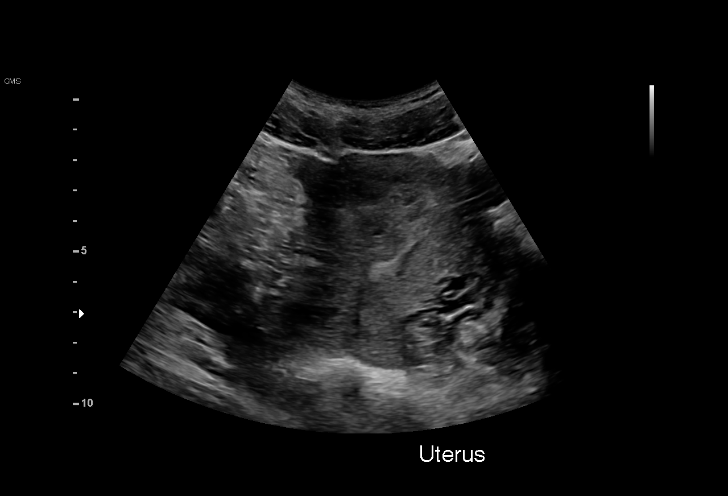
[im 9/39]
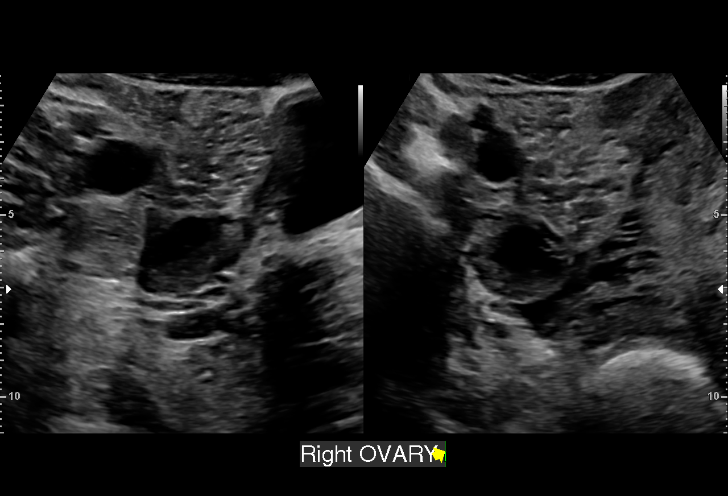
[im 12/39]
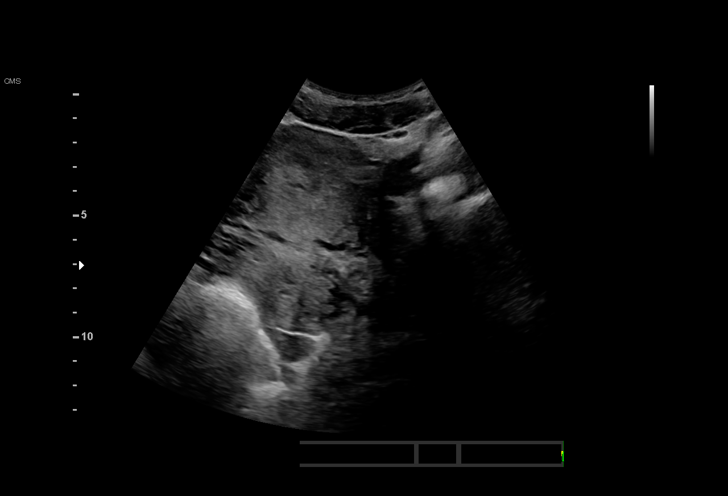
[im 15/39]
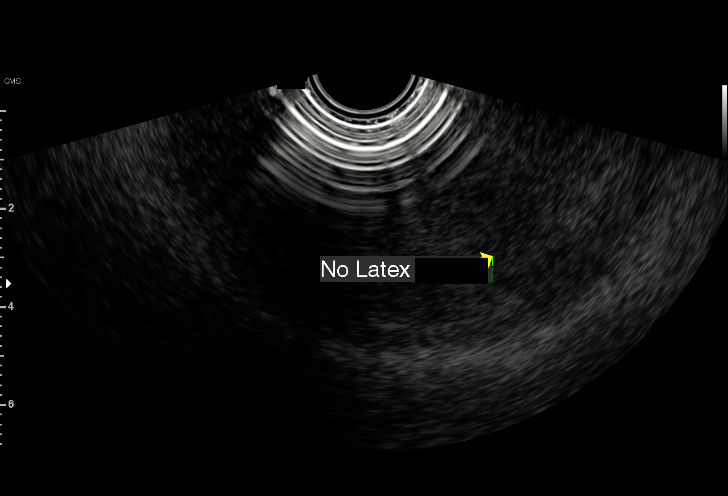
[im 17/39]
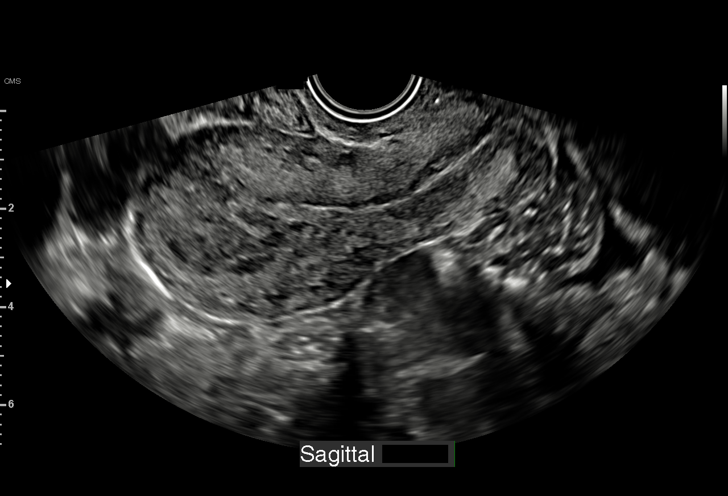
[im 20/39]
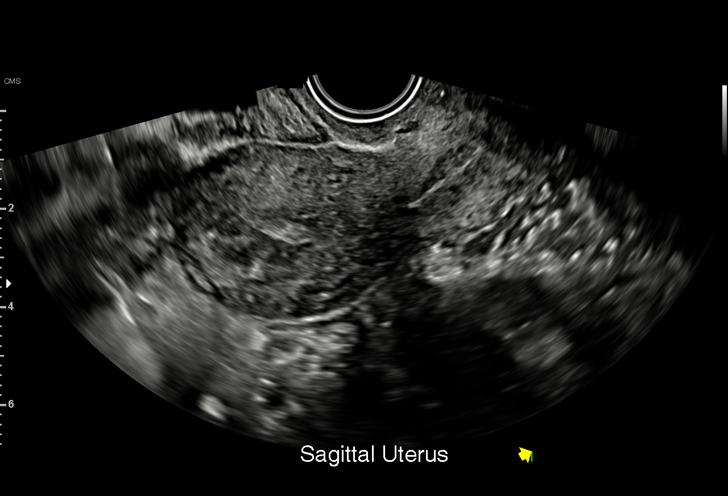
[im 22/39]
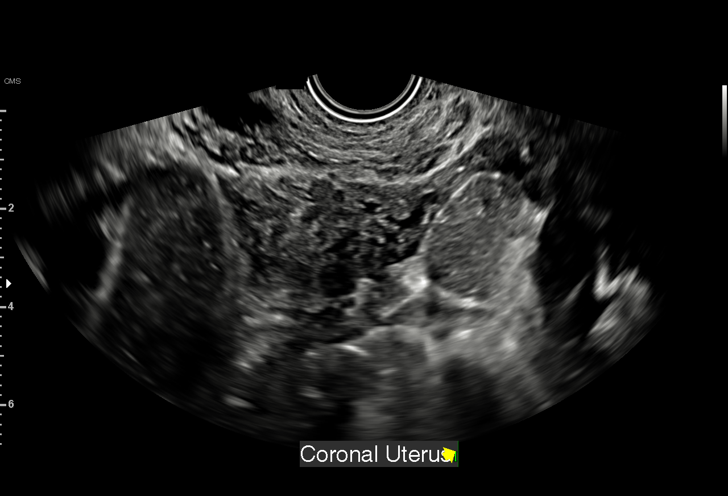
[im 24/39]
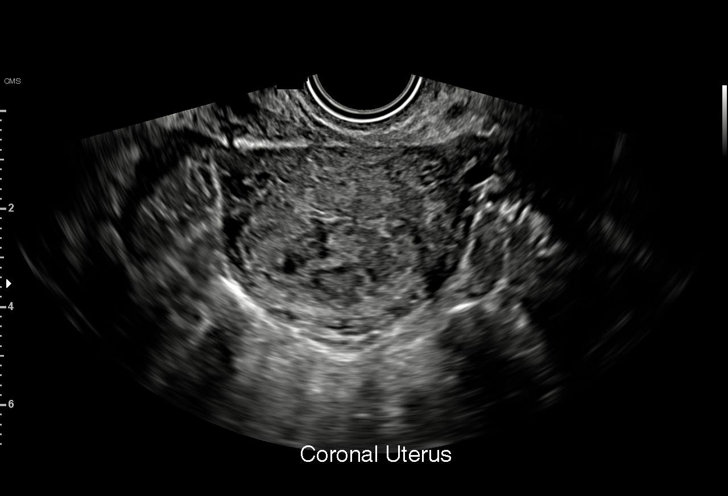
[im 27/39]
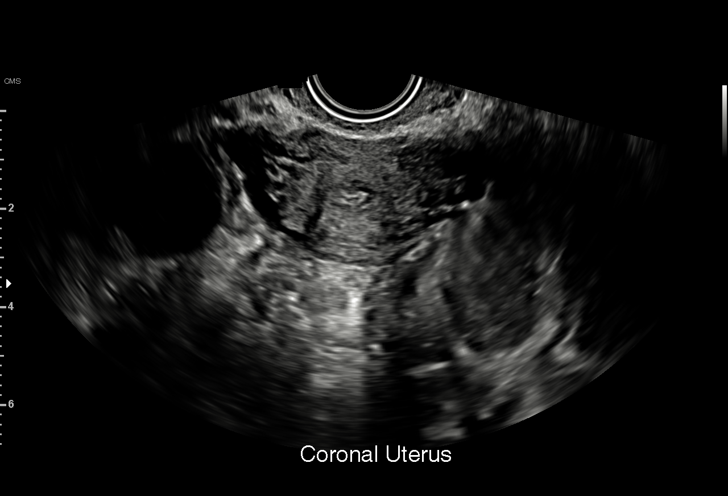
[im 30/39]
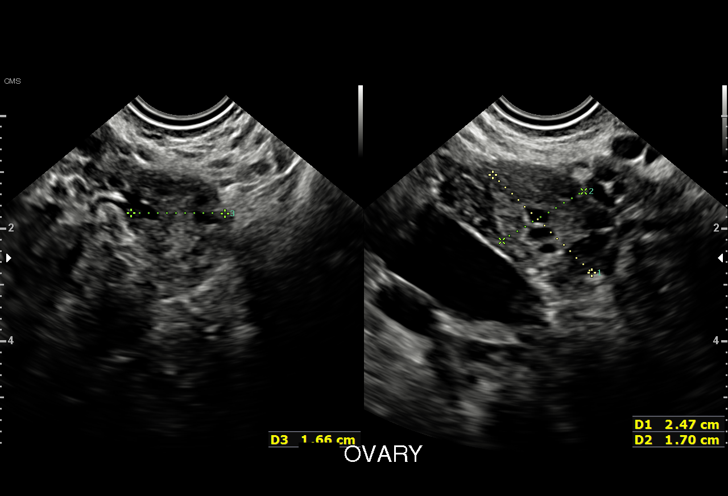
[im 33/39]
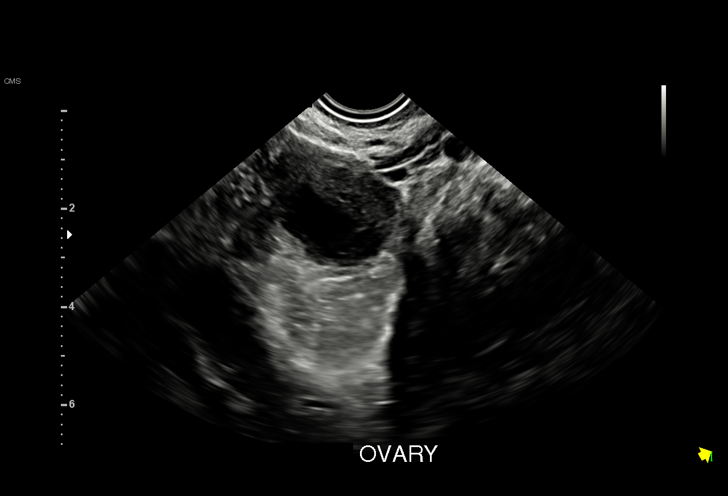
[im 36/39]
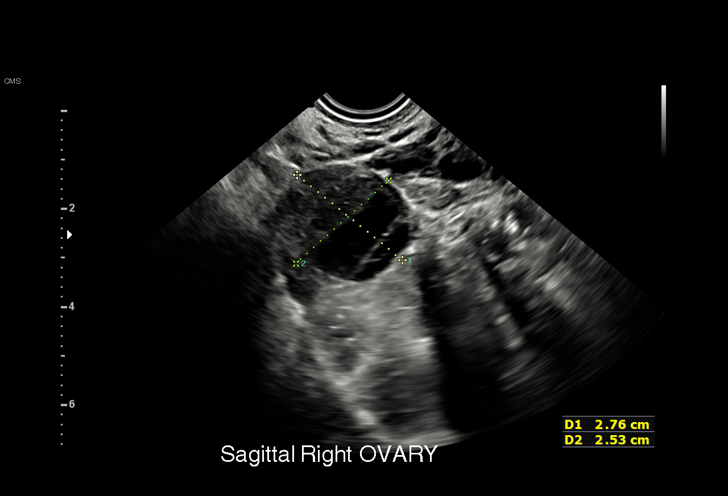
[im 39/39]
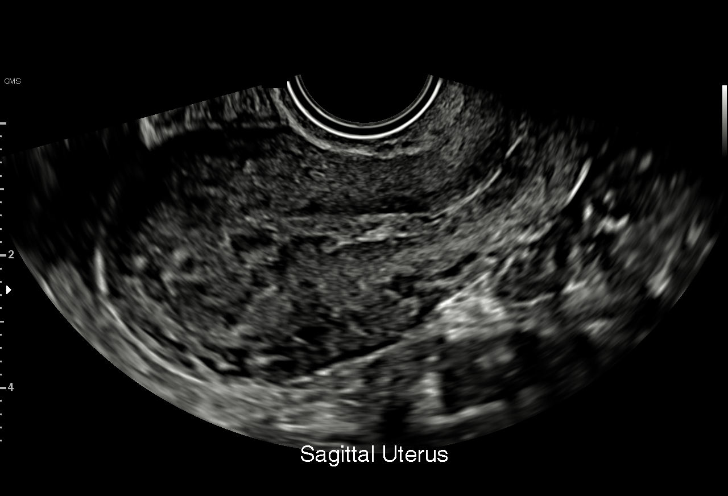

[15 of 28 positions shown; findings below may reference images not displayed]

FINDINGS: Intrauterine gestational sac: Not seen

Yolk sac:  Not seen

Embryo:  Not seen

Cardiac Activity: Not seen

Endometrial stripe measures approximately 3.8 mm. Cervix is closed.
There is no demonstrable fluid collection in the endometrial cavity.

Maternal uterus/adnexae: There is 2.1 cm complex cyst in the right
adnexa. Small follicles are seen in the left ovary. There is no free
fluid in the pelvis.
IMPRESSION: There is no demonstrable intrauterine gestational sac. If pregnancy
test is positive, differential diagnostic possibilities would
include failed gestation with complete abortion or very early IUP or
ectopic gestation. Serial HCG estimations and follow-up sonogram as
warranted should be considered.

There is 2.1 cm complex cyst in the right adnexa, possibly
hemorrhagic cyst/follicle.

## 2023-08-10 NOTE — L&D Delivery Note (Signed)
 Delivery Note At 4:02 AM a viable female was delivered via Vaginal, Spontaneous (Presentation:    Right Occiput Anterior).  APGAR: 8, 9; weight  pending.   Placenta status:  spont,intact  .  Cord: 3 vessels with the following complications: None.  Cord pH: n/a Light Meconium with SROM   Anesthesia: Epidural Episiotomy: None Lacerations:  none Suture Repair: n/a Est. Blood Loss (mL):  ~100  Mom to postpartum.  Baby to Couplet care / Skin to Skin.  [name] 08/01/2024, 4:20 AM

## 2024-07-09 LAB — OB RESULTS CONSOLE GBS: GBS: POSITIVE

## 2024-07-31 ENCOUNTER — Inpatient Hospital Stay (HOSPITAL_COMMUNITY)
Admission: AD | Admit: 2024-07-31 | Discharge: 2024-08-02 | DRG: 807 | Disposition: A | Discharge status: Home / Self Care | Attending: Obstetrics and Gynecology | Admitting: Obstetrics and Gynecology

## 2024-07-31 ENCOUNTER — Encounter (HOSPITAL_COMMUNITY): Payer: Self-pay | Admitting: *Deleted

## 2024-07-31 DIAGNOSIS — O99824 Streptococcus B carrier state complicating childbirth: Principal | ICD-10-CM | POA: Diagnosis present

## 2024-07-31 DIAGNOSIS — Z3A39 39 weeks gestation of pregnancy: Secondary | ICD-10-CM

## 2024-07-31 DIAGNOSIS — Z833 Family history of diabetes mellitus: Secondary | ICD-10-CM

## 2024-07-31 NOTE — MAU Note (Signed)
 Lori [name] is a 23 y.o. at [redacted]w[redacted]d here in MAU reporting having membrane sweep today and was 2cm. Ctxs started about 2130 tonight and have gotten closer. Pt states labored fast with first baby. Currently has the flu and started with symptoms Sat. Reports good FM and denies LOF. Some bloody show  LMP: na Onset of complaint: tonight Pain score: 8 Vitals:   07/31/24 2350 07/31/24 2352  BP:  139/71  Pulse: (!) 106   Resp: 18   Temp: 98.2 F (36.8 C)   SpO2: 98%      FHT: 150  Lab orders placed from triage: labor eval

## 2024-08-01 ENCOUNTER — Inpatient Hospital Stay (HOSPITAL_COMMUNITY): Admitting: Anesthesiology

## 2024-08-01 ENCOUNTER — Other Ambulatory Visit: Payer: Self-pay

## 2024-08-01 ENCOUNTER — Encounter (HOSPITAL_COMMUNITY): Payer: Self-pay | Admitting: Obstetrics and Gynecology

## 2024-08-01 DIAGNOSIS — O99824 Streptococcus B carrier state complicating childbirth: Secondary | ICD-10-CM | POA: Diagnosis present

## 2024-08-01 DIAGNOSIS — Z833 Family history of diabetes mellitus: Secondary | ICD-10-CM | POA: Diagnosis not present

## 2024-08-01 DIAGNOSIS — Z3A39 39 weeks gestation of pregnancy: Secondary | ICD-10-CM | POA: Diagnosis not present

## 2024-08-01 DIAGNOSIS — O26893 Other specified pregnancy related conditions, third trimester: Secondary | ICD-10-CM | POA: Diagnosis present

## 2024-08-01 LAB — OB RESULTS CONSOLE GBS

## 2024-08-01 LAB — CBC
HCT: 33.9 % — ABNORMAL LOW (ref 36.0–46.0)
Hemoglobin: 11.7 g/dL — ABNORMAL LOW (ref 12.0–15.0)
MCH: 28 pg (ref 26.0–34.0)
MCHC: 34.5 g/dL (ref 30.0–36.0)
MCV: 81.1 fL (ref 80.0–100.0)
Platelets: 178 K/uL (ref 150–400)
RBC: 4.18 MIL/uL (ref 3.87–5.11)
RDW: 14.6 % (ref 11.5–15.5)
WBC: 5.2 K/uL (ref 4.0–10.5)
nRBC: 0 % (ref 0.0–0.2)

## 2024-08-01 LAB — TYPE AND SCREEN
ABO/RH(D): A POS
Antibody Screen: NEGATIVE

## 2024-08-01 LAB — SYPHILIS: RPR W/REFLEX TO RPR TITER AND TREPONEMAL ANTIBODIES, TRADITIONAL SCREENING AND DIAGNOSIS ALGORITHM: RPR Ser Ql: NONREACTIVE

## 2024-08-01 MED ORDER — LACTATED RINGERS IV SOLN: INTRAVENOUS | Status: DC

## 2024-08-01 MED ORDER — LIDOCAINE HCL (PF) 1 % IJ SOLN: 30.0000 mL | INTRAMUSCULAR | Status: DC | PRN

## 2024-08-01 MED ORDER — ACETAMINOPHEN 325 MG PO TABS: 650.0000 mg | ORAL_TABLET | ORAL | Status: DC | PRN

## 2024-08-01 MED ORDER — DIBUCAINE (PERIANAL) 1 % EX OINT: 1.0000 | TOPICAL_OINTMENT | CUTANEOUS | Status: DC | PRN

## 2024-08-01 MED ORDER — ONDANSETRON HCL 4 MG/2ML IJ SOLN: 4.0000 mg | INTRAMUSCULAR | Status: DC | PRN

## 2024-08-01 MED ORDER — SODIUM CHLORIDE 0.9 % IV SOLN
5.0000 10*6.[IU] | Freq: Once | INTRAVENOUS | Status: AC
  Administered 2024-08-01: 5 10*6.[IU] via INTRAVENOUS
  Filled 2024-08-01: qty 5

## 2024-08-01 MED ORDER — OXYCODONE-ACETAMINOPHEN 5-325 MG PO TABS: 2.0000 | ORAL_TABLET | ORAL | Status: DC | PRN

## 2024-08-01 MED ORDER — SOD CITRATE-CITRIC ACID 500-334 MG/5ML PO SOLN: 30.0000 mL | ORAL | Status: DC | PRN

## 2024-08-01 MED ORDER — OXYCODONE HCL 5 MG PO TABS: 10.0000 mg | ORAL_TABLET | ORAL | Status: DC | PRN

## 2024-08-01 MED ORDER — WITCH HAZEL-GLYCERIN EX PADS: 1.0000 | MEDICATED_PAD | CUTANEOUS | Status: DC | PRN

## 2024-08-01 MED ORDER — OXYTOCIN BOLUS FROM INFUSION
333.0000 mL | Freq: Once | INTRAVENOUS | Status: AC
  Administered 2024-08-01: 333 mL via INTRAVENOUS

## 2024-08-01 MED ORDER — TRANEXAMIC ACID-NACL 1000-0.7 MG/100ML-% IV SOLN: 1000.0000 mg | Freq: Once | INTRAVENOUS | Status: AC

## 2024-08-01 MED ORDER — TETANUS-DIPHTH-ACELL PERTUSSIS 5-2-15.5 LF-MCG/0.5 IM SUSP: 0.5000 mL | Freq: Once | INTRAMUSCULAR | Status: DC

## 2024-08-01 MED ORDER — PRENATAL MULTIVITAMIN CH
1.0000 | ORAL_TABLET | Freq: Every day | ORAL | Status: DC
  Administered 2024-08-01 – 2024-08-02 (×2): 1 via ORAL
  Filled 2024-08-01 (×2): qty 1

## 2024-08-01 MED ORDER — LACTATED RINGERS IV SOLN: 500.0000 mL | INTRAVENOUS | Status: DC | PRN

## 2024-08-01 MED ORDER — ZOLPIDEM TARTRATE 5 MG PO TABS: 5.0000 mg | ORAL_TABLET | Freq: Every evening | ORAL | Status: DC | PRN

## 2024-08-01 MED ORDER — SIMETHICONE 80 MG PO CHEW: 80.0000 mg | CHEWABLE_TABLET | ORAL | Status: DC | PRN

## 2024-08-01 MED ORDER — BENZOCAINE-MENTHOL 20-0.5 % EX AERO: 1.0000 | INHALATION_SPRAY | CUTANEOUS | Status: DC | PRN

## 2024-08-01 MED ORDER — OXYTOCIN-SODIUM CHLORIDE 30-0.9 UT/500ML-% IV SOLN
2.5000 [IU]/h | INTRAVENOUS | Status: DC
  Administered 2024-08-01: 2.5 [IU]/h via INTRAVENOUS
  Filled 2024-08-01: qty 500

## 2024-08-01 MED ORDER — SENNOSIDES-DOCUSATE SODIUM 8.6-50 MG PO TABS
2.0000 | ORAL_TABLET | Freq: Every day | ORAL | Status: DC
  Administered 2024-08-02: 2 via ORAL
  Filled 2024-08-01: qty 2

## 2024-08-01 MED ORDER — ONDANSETRON HCL 4 MG/2ML IJ SOLN: 4.0000 mg | Freq: Four times a day (QID) | INTRAMUSCULAR | Status: DC | PRN

## 2024-08-01 MED ORDER — COCONUT OIL OIL: 1.0000 | TOPICAL_OIL | Status: DC | PRN

## 2024-08-01 MED ORDER — OXYCODONE HCL 5 MG PO TABS: 5.0000 mg | ORAL_TABLET | ORAL | Status: DC | PRN

## 2024-08-01 MED ORDER — DIPHENHYDRAMINE HCL 25 MG PO CAPS: 25.0000 mg | ORAL_CAPSULE | Freq: Four times a day (QID) | ORAL | Status: DC | PRN

## 2024-08-01 MED ORDER — IBUPROFEN 600 MG PO TABS
600.0000 mg | ORAL_TABLET | Freq: Four times a day (QID) | ORAL | Status: DC
  Administered 2024-08-01 – 2024-08-02 (×6): 600 mg via ORAL
  Filled 2024-08-01 (×6): qty 1

## 2024-08-01 MED ORDER — FENTANYL-BUPIVACAINE-NACL 0.5-0.125-0.9 MG/250ML-% EP SOLN
EPIDURAL | Status: AC
  Filled 2024-08-01: qty 250

## 2024-08-01 MED ORDER — FLEET ENEMA RE ENEM: 1.0000 | ENEMA | RECTAL | Status: DC | PRN

## 2024-08-01 MED ORDER — PENICILLIN G POT IN DEXTROSE 60000 UNIT/ML IV SOLN
3.0000 10*6.[IU] | INTRAVENOUS | Status: DC
  Filled 2024-08-01 (×2): qty 50

## 2024-08-01 MED ORDER — OXYCODONE-ACETAMINOPHEN 5-325 MG PO TABS: 1.0000 | ORAL_TABLET | ORAL | Status: DC | PRN

## 2024-08-01 MED ORDER — TRANEXAMIC ACID-NACL 1000-0.7 MG/100ML-% IV SOLN
INTRAVENOUS | Status: AC
  Filled 2024-08-01: qty 100

## 2024-08-01 MED ORDER — ONDANSETRON HCL 4 MG PO TABS: 4.0000 mg | ORAL_TABLET | ORAL | Status: DC | PRN

## 2024-08-01 NOTE — Anesthesia Preprocedure Evaluation (Addendum)
 "                                  Anesthesia Evaluation  Patient identified by MRN, date of birth, ID band Patient awake    Reviewed: Allergy & Precautions, NPO status , Patient's Chart, lab work & pertinent test results  Airway Mallampati: II  TM Distance: >3 FB Neck ROM: Full    Dental no notable dental hx. (+) Teeth Intact, Dental Advisory Given   Pulmonary neg pulmonary ROS   Pulmonary exam normal breath sounds clear to auscultation       Cardiovascular negative cardio ROS Normal cardiovascular exam Rhythm:Regular Rate:Normal     Neuro/Psych negative neurological ROS     GI/Hepatic negative GI ROS, Neg liver ROS,,,  Endo/Other  negative endocrine ROS    Renal/GU negative Renal ROS     Musculoskeletal   Abdominal   Peds  Hematology Lab Results      Component                Value               Date                      WBC                      5.2                 08/01/2024                HGB                      11.7 (L)            08/01/2024                HCT                      33.9 (L)            08/01/2024                MCV                      81.1                08/01/2024                PLT                      178                 08/01/2024              Anesthesia Other Findings   Reproductive/Obstetrics (+) Pregnancy                              Anesthesia Physical Anesthesia Plan  ASA: 2  Anesthesia Plan: Epidural   Post-op Pain Management:    Induction:   PONV Risk Score and Plan:   Airway Management Planned:   Additional Equipment:   Intra-op Plan:   Post-operative Plan:   Informed Consent: I have reviewed the patients History and Physical, chart, labs and discussed the procedure including the risks, benefits and alternatives for the proposed  anesthesia with the patient or authorized representative who has indicated his/her understanding and acceptance.       Plan Discussed with:    Anesthesia Plan Comments: (39.3 wk G2P1 for LEA)        Anesthesia Quick Evaluation  "

## 2024-08-01 NOTE — Anesthesia Procedure Notes (Signed)
 Epidural Patient location during procedure: OB Start time: 08/01/2024 1:16 AM End time: 08/01/2024 1:27 AM  Staffing Anesthesiologist: Jefm Garnette LABOR, MD Performed: anesthesiologist   Preanesthetic Checklist Completed: patient identified, IV checked, site marked, risks and benefits discussed, surgical consent, monitors and equipment checked, pre-op evaluation and timeout performed  Epidural Patient position: sitting Prep: DuraPrep and site prepped and draped Patient monitoring: continuous pulse ox and blood pressure Approach: midline Location: L3-L4 Injection technique: LOR air  Needle:  Needle type: Tuohy  Needle gauge: 17 G Needle length: 9 cm and 9 Needle insertion depth: 7 cm Catheter type: closed end flexible Catheter size: 19 Gauge Catheter at skin depth: 12 cm Test dose: negative  Assessment Events: blood not aspirated, no cerebrospinal fluid, injection not painful, no injection resistance, no paresthesia and negative IV test  Additional Notes Patient identified. Risks/Benefits/Options discussed with patient including but not limited to bleeding, infection, nerve damage, paralysis, failed block, incomplete pain control, headache, blood pressure changes, nausea, vomiting, reactions to medication both or allergic, itching and postpartum back pain. Confirmed with bedside nurse the patient's most recent platelet count. Confirmed with patient that they are not currently taking any anticoagulation, have any bleeding history or any family history of bleeding disorders. Patient expressed understanding and wished to proceed. All questions were answered. Sterile technique was used throughout the entire procedure. Please see nursing notes for vital signs. Test dose was given through epidural needle and negative prior to continuing to dose epidural or start infusion. Warning signs of high block given to the patient including shortness of breath, tingling/numbness in hands, complete  motor block, or any concerning symptoms with instructions to call for help. Patient was given instructions on fall risk and not to get out of bed. All questions and concerns addressed with instructions to call with any issues.  1 Attempt (S) . Patient tolerated procedure well.

## 2024-08-01 NOTE — H&P (Signed)
 Lori [name] is a 23 y.o. female presenting for labor evaluation.  OB History     Gravida  2   Para  1   Term  1   Preterm      AB      Living  1      SAB      IAB      Ectopic      Multiple  0   Live Births  1          Past Medical History:  Diagnosis Date   Medical history non-contributory    Past Surgical History:  Procedure Laterality Date   NO PAST SURGERIES     Family History: family history includes Diabetes in her mother; Varicose Veins in her father. Social History:  reports that she has never smoked. She has never used smokeless tobacco. She reports that she does not currently use alcohol. She reports that she does not currently use drugs after having used the following drugs: Marijuana.     Maternal Diabetes: No Genetic Screening: Normal Maternal Ultrasounds/Referrals: Normal (w/echogenic foci) Fetal Ultrasounds or other Referrals:  None Maternal Substance Abuse:  No Significant Maternal Medications:  None Significant Maternal Lab Results:  Group B Strep positive Number of Prenatal Visits:greater than 3 verified prenatal visits Maternal Vaccinations:RSV: Given during pregnancy >/=14 days ago and TDap Other Comments:  None  Review of Systems URI recently with temp 99 yesterday, no C/N/V/D History Dilation: 10 Effacement (%): 100 Station: 0 Exam by:: Lori Dears, RN Blood pressure 112/67, pulse 79, temperature 98.3 F (36.8 C), temperature source Oral, resp. rate 18, height 5' 7 (1.702 m), weight 94.8 kg, SpO2 98%, unknown if currently breastfeeding. Exam Physical Exam  Lungs unlabored breathing CV RRR Abdomen gravid NT Extremities no calf tenderness  Prenatal labs: ABO, Rh: --/--/A POS (12/24 0015) Antibody: NEG (12/24 0015) Rubella:  Immune RPR:   NR HBsAg:   Neg HIV:   NR GBS: Positive/-- (12/01 0000)   Assessment/Plan: 23yo G2P1001 at 39 3/7wks presenting in labor. Fetal status overall reassuring.  Consult  anesthesia for an epidural.  PCN for GBS prophylaxis.   Lori [name] 08/01/2024, 4:13 AM

## 2024-08-01 NOTE — Anesthesia Postprocedure Evaluation (Signed)
"   Anesthesia Post Note  Patient: Lori [name]  Procedure(s) Performed: AN AD HOC LABOR EPIDURAL     Patient location during evaluation: Mother Baby Anesthesia Type: Epidural Level of consciousness: awake, awake and alert and oriented Pain management: satisfactory to patient Vital Signs Assessment: post-procedure vital signs reviewed and stable Respiratory status: spontaneous breathing, nonlabored ventilation and respiratory function stable Cardiovascular status: blood pressure returned to baseline and stable Postop Assessment: no backache, no headache, patient able to bend at knees, no apparent nausea or vomiting, able to ambulate and adequate PO intake Anesthetic complications: no   No notable events documented.  Last Vitals:  Vitals:   08/01/24 1241 08/01/24 1524  BP: 124/64 138/71  Pulse: 61 62  Resp: 17 18  Temp: (!) 36.4 C 36.6 C  SpO2: 99% 100%    Last Pain:  Vitals:   08/01/24 1526  TempSrc:   PainSc: 0-No pain   Pain Goal: Patients Stated Pain Goal: 0 (07/31/24 2353)                 Adrienne Trombetta      "

## 2024-08-01 NOTE — Lactation Note (Signed)
 This note was copied from a baby's chart. Lactation Consultation Note  Patient Name: Lori [name] Date: 08/01/2024 Age:24 hours  Reason for consult: Initial assessment;Term;Breastfeeding assistance  P2, [redacted]w[redacted]d  Initial LC visit to see mother and baby Lori Kaiyel. Mother has experience with breastfeeding first child for 11 months. He is now 21 years old. Mother wanted assistance with waking baby to feed. Baby removed from skin to skin and blankets off. Mother given pillows for support. Mother was able to latch her baby independently. Kaiyel latched well with wide gape and mother feeling comfortable tugs on the breast.   Mother encouraged to latch baby with feeding cues, place baby skin to skin if not latching, and call for assistance with breastfeeding as needed. Informed mother regarding cluster feeding as baby approaches 24 hours of age. Baby should want to increase feeding frequency, often breastfeeding 8-12 times in 24 hours and cluster feedings.   Handout provided of O/P services, breastfeeding support groups, and our phone # for post-discharge questions.      Maternal Data Has patient been taught Hand Expression?: Yes Does the patient have breastfeeding experience prior to this delivery?: Yes How long did the patient breastfeed?: 11 months  Feeding Mother's Current Feeding Choice: Breast Milk  LATCH Score Latch: Grasps breast easily, tongue down, lips flanged, rhythmical sucking.  Audible Swallowing: A few with stimulation  Type of Nipple: Everted at rest and after stimulation  Comfort (Breast/Nipple): Soft / non-tender  Hold (Positioning): Assistance needed to correctly position infant at breast and maintain latch.  LATCH Score: 8       Interventions Interventions: Breast feeding basics reviewed;Assisted with latch;Hand express;Support pillows;Education;LC Services brochure;CDC milk storage guidelines;CDC Guidelines for Breast Pump  Cleaning  Discharge Pump: DEBP;Personal (Spectra  and Lansinoh)  Consult Status Consult Status: Follow-up Date: 08/02/24 Follow-up type: In-patient    Joshua Line M 08/01/2024, 9:03 AM

## 2024-08-02 LAB — CBC
HCT: 32 % — ABNORMAL LOW (ref 36.0–46.0)
Hemoglobin: 10.5 g/dL — ABNORMAL LOW (ref 12.0–15.0)
MCH: 27.3 pg (ref 26.0–34.0)
MCHC: 32.8 g/dL (ref 30.0–36.0)
MCV: 83.1 fL (ref 80.0–100.0)
Platelets: 156 K/uL (ref 150–400)
RBC: 3.85 MIL/uL — ABNORMAL LOW (ref 3.87–5.11)
RDW: 14.7 % (ref 11.5–15.5)
WBC: 6.2 K/uL (ref 4.0–10.5)
nRBC: 0 % (ref 0.0–0.2)

## 2024-08-02 MED ORDER — ACETAMINOPHEN 325 MG PO TABS: 650.0000 mg | ORAL_TABLET | ORAL | 0 refills | Status: AC | PRN

## 2024-08-02 MED ORDER — IBUPROFEN 600 MG PO TABS: 600.0000 mg | ORAL_TABLET | Freq: Four times a day (QID) | ORAL | 0 refills | Status: AC

## 2024-08-02 NOTE — Progress Notes (Signed)
 MOB was referred for history of depression/anxiety. * Referral screened out by Clinical Social Worker because none of the following criteria appear to apply: ~ History of anxiety/depression during this pregnancy, or of post-partum depression. ~ Diagnosis of anxiety and/or depression within last 3 years OR * MOB's symptoms currently being treated with medication and/or therapy. No concerns noted in OB records. Per OB records, MOB also has a therapist.   MOB's Edinburgh Score was 0.  Please contact the Clinical Social Worker if needs arise, or if MOB requests.  Clayborne Hope, MSW, LCSW Clinical Social Work [phone]

## 2024-08-02 NOTE — Lactation Note (Signed)
 This note was copied from a baby's chart. Lactation Consultation Note  Patient Name: Lori [name] Unijb'd Date: 08/02/2024 Age:24 hours Reason for consult: Follow-up assessment;Term LC used N95 mask and gloves as PPE when consulting with MOB while she is FLU+.  P2- MOB reports that infant has been feeding great today. MOB reported feeling concerned yesterday because infant was sleepy, but now that infant is past 24 hrs she is more alert. Infant was latched to the left breast in the side lying position during consult. Infant was belly to belly with MOB and had a strong rhythmic pull. MOB denies having any concerns at this time. LC reviewed cluster feeding and what to expect. LC encouraged MOB to call for further assistance as needed.  Maternal Data Has patient been taught Hand Expression?: No Does the patient have breastfeeding experience prior to this delivery?: Yes  Feeding Mother's Current Feeding Choice: Breast Milk  LATCH Score Latch: Grasps breast easily, tongue down, lips flanged, rhythmical sucking.  Audible Swallowing: Spontaneous and intermittent  Type of Nipple: Everted at rest and after stimulation  Comfort (Breast/Nipple): Soft / non-tender  Hold (Positioning): No assistance needed to correctly position infant at breast.  LATCH Score: 10   Lactation Tools Discussed/Used Pump Education: Milk Storage  Interventions Interventions: Breast feeding basics reviewed;Education;LC Services brochure  Discharge Discharge Education: Engorgement and breast care;Warning signs for feeding baby Pump: DEBP;Hands Free;Personal  Consult Status Consult Status: Follow-up Date: 08/03/24 Follow-up type: In-patient    Recardo Hoit BS, IBCLC 08/02/2024, 1:56 PM

## 2024-08-02 NOTE — Discharge Summary (Signed)
 "    Postpartum Discharge Summary  Date of Service updated 08/02/24     Patient Name: Lori [name] DOB: 02/14/2001 MRN: [id number]  Date of admission: 07/31/2024 Delivery date:08/01/2024 Delivering provider: HENRY SLOUGH Date of discharge: 08/02/2024  Admitting diagnosis: Normal labor and delivery [O80] NSVD (normal spontaneous vaginal delivery) [O80] Intrauterine pregnancy: [redacted]w[redacted]d     Secondary diagnosis:  Principal Problem:   Normal labor and delivery Active Problems:   NSVD (normal spontaneous vaginal delivery)  Additional problems: none   Discharge diagnosis: Term Pregnancy Delivered                                              Post partum procedures:none Augmentation: N/A Complications: None  Hospital course: Onset of Labor With Vaginal Delivery      23 y.o. yo [id number] at [redacted]w[redacted]d was admitted in Active Labor on 07/31/2024. Labor course was uncomplicated. Membrane Rupture Time/Date: 2:15 AM,08/01/2024  Delivery Method:Vaginal, Spontaneous Operative Delivery:N/A Episiotomy: None Lacerations:   none Patient had a postpartum course was uncomplicated.  She is ambulating, tolerating a regular diet, passing flatus, and urinating well. Patient is discharged home in stable condition on 08/02/2024.  Newborn Data: Birth date:08/01/2024 Birth time:4:02 AM Gender:Female Living status:Living Apgars:8 ,9  Weight:3420 g  Magnesium  Sulfate received: No BMZ received: No Rhophylac:No MMR:Yes T-DaP:Given prenatally Flu: Yes RSV Vaccine received: Yes Transfusion:No Immunizations administered: There is no immunization history for the selected administration types on file for this patient.  Physical exam  Vitals:   08/01/24 1524 08/01/24 1912 08/02/24 0135 08/02/24 0455  BP: 138/71 (!) 197/84 118/78 125/74  Pulse: 62  60 60  Resp: 18 15 18 18   Temp: 97.9 F (36.6 C) 98.1 F (36.7 C) 97.8 F (36.6 C) 97.8 F (36.6 C)  TempSrc: Oral  Oral Oral  SpO2: 100%  100%  100%  Weight:      Height:       General: alert, cooperative, and no distress Lochia: appropriate Uterine Fundus: firm Incision: N/A DVT Evaluation: No evidence of DVT seen on physical exam. Labs: Lab Results  Component Value Date   WBC 6.2 08/02/2024   HGB 10.5 (L) 08/02/2024   HCT 32.0 (L) 08/02/2024   MCV 83.1 08/02/2024   PLT 156 08/02/2024      Latest Ref Rng & Units 04/11/2022   11:28 AM  CMP  Glucose 70 - 99 mg/dL 76   BUN 6 - 20 mg/dL 5   Creatinine 9.55 - 8.99 mg/dL 9.37   Sodium 864 - 854 mmol/L 132   Potassium 3.5 - 5.1 mmol/L 3.7   Chloride 98 - 111 mmol/L 101   CO2 22 - 32 mmol/L 23   Calcium 8.9 - 10.3 mg/dL 8.6   Total Protein 6.5 - 8.1 g/dL 7.1   Total Bilirubin 0.3 - 1.2 mg/dL 0.4   Alkaline Phos 38 - 126 U/L 63   AST 15 - 41 U/L 17   ALT 0 - 44 U/L 10    Edinburgh Score:    08/01/2024   12:40 PM  Edinburgh Postnatal Depression Scale Screening Tool  I have been able to laugh and see the funny side of things. 0  I have looked forward with enjoyment to things. 0  I have blamed myself unnecessarily when things went wrong. 0  I have been anxious or worried for no good reason.  0  I have felt scared or panicky for no good reason. 0  Things have been getting on top of me. 0  I have been so unhappy that I have had difficulty sleeping. 0  I have felt sad or miserable. 0  I have been so unhappy that I have been crying. 0  The thought of harming myself has occurred to me. 0  Edinburgh Postnatal Depression Scale Total 0      After visit meds:  Allergies as of 08/02/2024   No Known Allergies      Medication List     STOP taking these medications    ondansetron  4 MG disintegrating tablet Commonly known as: ZOFRAN -ODT       TAKE these medications    acetaminophen  325 MG tablet Commonly known as: Tylenol  Take 2 tablets (650 mg total) by mouth every 4 (four) hours as needed (for pain scale < 4).   ibuprofen  600 MG tablet Commonly known as:  ADVIL  Take 1 tablet (600 mg total) by mouth every 6 (six) hours.   IRON PO Take 1 tablet by mouth in the morning.   MAGNESIUM  OXIDE PO Take 1 tablet by mouth in the morning.   PRENATAL PO Take 1 tablet by mouth in the morning.         Discharge home in stable condition Infant Feeding: Breast Infant Disposition:home with mother Discharge instruction: per After Visit Summary and Postpartum booklet. Activity: Advance as tolerated. Pelvic rest for 6 weeks.  Diet: routine diet Anticipated Birth Control: Unsure Postpartum Appointment:6 weeks Additional Postpartum F/U: none Future Appointments:No future appointments. Follow up Visit:  Follow-up Information     [facility] Follow up in 6 day(s).   Specialty: Obstetrics and Gynecology Contact information: [address] [place]  [address] [phone]                    08/02/2024 Emmie CHRISTELLA Corp, MD   "

## 2024-08-07 ENCOUNTER — Telehealth (HOSPITAL_COMMUNITY): Payer: Self-pay

## 2024-08-07 NOTE — Telephone Encounter (Signed)
 08/07/2024 1128  Name: Mandolin Falwell MRN: [id number] DOB: 10-Oct-2000  Reason for Call:  Transition of Care Hospital Discharge Call  Contact Status: Patient Contact Status: Message  Language assistant needed:          Follow-Up Questions:    Van Postnatal Depression Scale:  In the Past 7 Days:    PHQ2-9 Depression Scale:     Discharge Follow-up:    Post-discharge interventions: NA  Signature  Rosaline Deretha PEAK
